# Patient Record
Sex: Female | Born: 1980 | Race: White | Hispanic: No | Marital: Married | Smoking: Never smoker
Health system: Southern US, Community
[De-identification: ages and names within clinical notes are randomized; demographics above are authoritative.]

## PROBLEM LIST (undated history)

## (undated) DIAGNOSIS — T7840XA Allergy, unspecified, initial encounter: Secondary | ICD-10-CM

## (undated) DIAGNOSIS — K602 Anal fissure, unspecified: Secondary | ICD-10-CM

## (undated) DIAGNOSIS — N200 Calculus of kidney: Secondary | ICD-10-CM

## (undated) HISTORY — PX: CHEST TUBE INSERTION: SHX231

## (undated) HISTORY — DX: Anal fissure, unspecified: K60.2

## (undated) HISTORY — DX: Allergy, unspecified, initial encounter: T78.40XA

## (undated) HISTORY — PX: WISDOM TOOTH EXTRACTION: SHX21

## (undated) HISTORY — PX: OTHER SURGICAL HISTORY: SHX169

## (undated) HISTORY — DX: Calculus of kidney: N20.0

---

## 2002-11-26 DIAGNOSIS — N200 Calculus of kidney: Secondary | ICD-10-CM

## 2002-11-26 HISTORY — DX: Calculus of kidney: N20.0

## 2005-07-31 ENCOUNTER — Encounter: Payer: Self-pay | Admitting: Emergency Medicine

## 2008-02-23 ENCOUNTER — Ambulatory Visit: Payer: Self-pay | Admitting: Gastroenterology

## 2008-03-16 ENCOUNTER — Emergency Department (HOSPITAL_COMMUNITY): Admission: EM | Admit: 2008-03-16 | Discharge: 2008-03-16 | Payer: Self-pay | Admitting: Emergency Medicine

## 2008-04-03 DIAGNOSIS — Z87442 Personal history of urinary calculi: Secondary | ICD-10-CM

## 2008-04-06 ENCOUNTER — Ambulatory Visit: Payer: Self-pay | Admitting: Gastroenterology

## 2008-04-06 DIAGNOSIS — K602 Anal fissure, unspecified: Secondary | ICD-10-CM | POA: Insufficient documentation

## 2009-04-26 ENCOUNTER — Inpatient Hospital Stay (HOSPITAL_COMMUNITY): Admission: AD | Admit: 2009-04-26 | Discharge: 2009-04-28 | Payer: Self-pay | Admitting: Obstetrics and Gynecology

## 2009-07-06 ENCOUNTER — Telehealth: Payer: Self-pay | Admitting: Gastroenterology

## 2009-12-29 ENCOUNTER — Encounter: Admission: RE | Admit: 2009-12-29 | Discharge: 2009-12-29 | Payer: Self-pay | Admitting: Family Medicine

## 2011-03-05 LAB — CBC
HCT: 41.6 % (ref 36.0–46.0)
Hemoglobin: 14.3 g/dL (ref 12.0–15.0)
MCHC: 34.4 g/dL (ref 30.0–36.0)
MCV: 89.8 fL (ref 78.0–100.0)
Platelets: 185 10*3/uL (ref 150–400)
Platelets: 218 10*3/uL (ref 150–400)
RBC: 3.7 MIL/uL — ABNORMAL LOW (ref 3.87–5.11)
RBC: 4.63 MIL/uL (ref 3.87–5.11)
RDW: 13.5 % (ref 11.5–15.5)
RDW: 13.7 % (ref 11.5–15.5)
WBC: 16.9 10*3/uL — ABNORMAL HIGH (ref 4.0–10.5)

## 2011-03-05 LAB — RH IMMUNE GLOB WKUP(>/=20WKS)(NOT WOMEN'S HOSP): Fetal Screen: NEGATIVE

## 2011-04-10 NOTE — Assessment & Plan Note (Signed)
[facility]                         GASTROENTEROLOGY OFFICE NOTE   NAME:Susan Key                       MRN:          [id number]  DATE:02/23/2008                            DOB:          03/30/1981    NEW GI CONSULTATION   She was self-referred.   CHIEF COMPLAINT:  Anal discomfort with BM.   HPI:  [name] is a very pleasant 30 year old woman who has had mild  intermittent rectal bleeding and anal discomfort on and off for at least  3-4 years.  She underwent a sigmoidoscopy at [facility] in 2006 and was told  that it was normal but that she probably had some intermittent anal  fissuring disease.  She moved from the [facility] area over the past  few months.  She has noticed that she is having a return of significant  anal discomfort but rare bleeding.  During January the pain was fairly  excruciating and she went to [facility] for evaluation.  They did an  examination and told her that she probably had an anal fissure.  She was  given nitroglycerin ointment and Xyralid cream, which she has been doing  fairly regularly.  They also put her on a Swiss Kriss laxative  formulation.  She has noticed that her anal discomfort has definitely  improved but she still does have some feeling of fullness or spasm-like  sensation with just about every bowel movement.  It was definitely  better than it was before.  She does not notice much bleeding at all  now.   REVIEW OF SYSTEMS:  Notable for no overt GI bleeding and also stable  weight and is otherwise essentially normal and is available on the  Nursing Intake Sheet.   PAST MEDICAL HISTORY:  1. History of kidney stones Fall of 2004.  2. Anal fissure, question as above.   CURRENT MEDICINES:  1. Birth control pills.  2. Xyralid cream.  3. Nitroglycerin 0.2% ointment.   ALLERGIES:  No known drug allergies.   SOCIAL HISTORY:  1. Married.  2. No children.  3. Lives with her husband.  4. Works as a  Lawyer.  5. Nonsmoker.  6. Rarely drinks alcohol.   FAMILY HISTORY:  1. Maternal grandmother with breast cancer.  2. No colon cancer or colon polyps in the family.   PHYSICAL EXAM:  Height 5 feet 4 inches, 124 pounds, blood pressure  98/48, pulse 68.  CONSTITUTIONAL:  Generally well appearing.  NEUROLOGIC:  Alert and oriented x3.  EYES:  Extraocular movements intact.  MOUTH:  Oropharynx moist, no lesions.  NECK:  Supple, no lymphadenopathy.  CARDIOVASCULAR:  Heart regular rate and rhythm.  LUNGS:  Clear to auscultation bilaterally.  ABDOMEN:  Soft, nontender, nondistended, normal bowel sounds.  EXTREMITIES:  No lower extremity edema.  SKIN:  No rashes or lesions on visible extremities.  ANORECTAL EXAMINATION:  With female CMA in room, revealed normal anus,  no fissuring disease.  On rectal palpation I felt a very small soft  nodularity in her posterior anal.  Stool was brown and not overtly  bloody.  No other masses in rectum.   ASSESSMENT AND PLAN:  A 30 year old woman with anal discomfort, likely  anal fissure.   She does not really have dramatic constipation but she says every once  in awhile she will really have to strain to move her bowels and I am  concerned that maybe at these times is when she may be creating these  anal fissures and then they just take quite awhile to heal up.  I am  getting her started on Citrucel fiber supplementation that she will  slowly increase up to a full heaping spoonful a day.  She will return to  see me in 8 weeks' time.  If her symptoms are not completely resolved, I  want to repeat a sigmoidoscopy since she does have a very slight but  soft nodule in her posterior anal canal that I did not get a very great  appreciation for in the examination room.     Rachael Fee, MD  Electronically Signed    DPJ/MedQ  DD: 02/23/2008  DT: 02/23/2008  Job #: [id number]

## 2011-05-21 ENCOUNTER — Encounter: Payer: Self-pay | Admitting: Gastroenterology

## 2011-05-21 ENCOUNTER — Telehealth: Payer: Self-pay | Admitting: Gastroenterology

## 2011-05-21 NOTE — Telephone Encounter (Signed)
Pt has had rectal bleeding, she was last seen in 2009.  Pt was scheduled to see Dr Christella Hartigan for evaluation.

## 2011-05-22 ENCOUNTER — Other Ambulatory Visit: Payer: Self-pay

## 2011-05-22 ENCOUNTER — Encounter: Payer: Self-pay | Admitting: Gastroenterology

## 2011-05-22 ENCOUNTER — Ambulatory Visit (INDEPENDENT_AMBULATORY_CARE_PROVIDER_SITE_OTHER): Payer: BC Managed Care – PPO | Admitting: Gastroenterology

## 2011-05-22 VITALS — BP 92/54 | HR 72 | Ht 65.0 in | Wt 124.6 lb

## 2011-05-22 DIAGNOSIS — K602 Anal fissure, unspecified: Secondary | ICD-10-CM

## 2011-05-22 MED ORDER — LIDOCAINE-HYDROCORTISONE ACE 3-0.5 % RE CREA: 1.0000 | TOPICAL_CREAM | RECTAL | Status: DC

## 2011-05-22 NOTE — Progress Notes (Signed)
Review of pertinent gastrointestinal problems: 1. 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 Flex sig at [facility] 2006 saw fissure, o/w was normal.  Flare of symptoms 2009, seen by Dr. Christella Hartigan, fiber helped.   HPI: This is a very pleasant 30 year old woman whom I last saw 3 years ago.   Has intermittent bright red bleeding (can last several days).  Has had mucous on stool.  She has pain with BM, pain with wiping during the bleeding episodes.  She has intermittent straining.    These are chronic problems that have been going on for 7-8 years. She has noticed really no significant changes in her symptoms  Currently [redacted] weeks pregnant.  Has been eating more fiber.  With 2010 pregnancy was very constipated, hemorrhoids.  After that childbirth she stopped fiber.  GM had colon cancer.      Review of systems: Pertinent positive and negative review of systems were noted in the above HPI section.  All other review of systems was otherwise negative.   Past Medical History  Diagnosis Date  . Anal fissure   . Kidney stones 2004    History reviewed. No pertinent past surgical history.   reports that she has never smoked. She has never used smokeless tobacco. She reports that she does not drink alcohol or use illicit drugs.  family history includes Breast cancer in her maternal grandmother and Colon polyps in her maternal grandmother.    Current Medications, Allergies were all reviewed with the patient via [facility] electronic medical record system.    Physical Exam: BP 92/54  Pulse 72  Ht 5\' 5"  (1.651 m)  Wt 124 lb 9.6 oz (56.518 kg)  BMI 20.73 kg/m2 Constitutional: generally well-appearing Psychiatric: alert and oriented x3 Eyes: extraocular movements intact Mouth: oral pharynx moist, no lesions Neck: supple no lymphadenopathy Cardiovascular: heart regular rate and rhythm Lungs: clear to auscultation bilaterally Abdomen: soft, nontender, nondistended, no obvious ascites, no  peritoneal signs, normal bowel sounds Extremities: no lower extremity edema bilaterally Skin: no lesions on visible extremities Rectal examination with female assistant in room: Small, nonthrombosed hemorrhoidal tissue externally. Shallow, 3 or 4 mm posterior anal fissure, midline, rectal examination found brown stool. 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 She will resume fiber supplements, sitz bath and topical ointments on a when necessary basis. She will return to me after her pregnancy and at that point I would likely proceed with repeat endoscopic evaluation with colonoscopy to make sure we are not missing anything else which I think is highly unlikely. If she is still bothered by chronic intermittent anal fissure symptoms then referred to a surgeon.

## 2011-05-22 NOTE — Patient Instructions (Addendum)
Please start taking citrucel (orange flavored) powder fiber supplement.  This may cause some bloating at first but that usually goes away. Begin with a small spoonful and work your way up to a large, heaping spoonful daily over a week. Sitz baths once daily Topical ointment called into your pharmacy apply when you are having anal discomfort once daily. A copy of this information will be made available to Dr. Manus Gunning. Return to see Dr. Christella Hartigan after you have delivered, Will readdress your fissure, probably proceed with colonoscopy and plan for surgical evaluation.

## 2011-06-22 ENCOUNTER — Other Ambulatory Visit: Payer: Self-pay

## 2011-06-29 ENCOUNTER — Ambulatory Visit: Payer: Self-pay | Admitting: Gastroenterology

## 2011-07-09 ENCOUNTER — Other Ambulatory Visit: Payer: Self-pay

## 2011-08-17 ENCOUNTER — Other Ambulatory Visit (HOSPITAL_COMMUNITY): Payer: Self-pay | Admitting: Obstetrics and Gynecology

## 2011-08-17 DIAGNOSIS — O358XX0 Maternal care for other (suspected) fetal abnormality and damage, not applicable or unspecified: Secondary | ICD-10-CM

## 2011-08-22 ENCOUNTER — Other Ambulatory Visit (HOSPITAL_COMMUNITY): Payer: Self-pay | Admitting: Obstetrics and Gynecology

## 2011-08-22 ENCOUNTER — Encounter (HOSPITAL_COMMUNITY): Payer: Self-pay

## 2011-08-22 ENCOUNTER — Ambulatory Visit (HOSPITAL_COMMUNITY)
Admission: RE | Admit: 2011-08-22 | Discharge: 2011-08-22 | Disposition: A | Discharge status: Home / Self Care | Payer: BC Managed Care – PPO | Source: Ambulatory Visit | Attending: Obstetrics and Gynecology | Admitting: Obstetrics and Gynecology

## 2011-08-22 ENCOUNTER — Other Ambulatory Visit: Payer: Self-pay | Admitting: Maternal and Fetal Medicine

## 2011-08-22 DIAGNOSIS — Z1389 Encounter for screening for other disorder: Secondary | ICD-10-CM | POA: Insufficient documentation

## 2011-08-22 DIAGNOSIS — O358XX0 Maternal care for other (suspected) fetal abnormality and damage, not applicable or unspecified: Secondary | ICD-10-CM

## 2011-08-22 DIAGNOSIS — Z363 Encounter for antenatal screening for malformations: Secondary | ICD-10-CM | POA: Insufficient documentation

## 2011-09-03 ENCOUNTER — Telehealth (HOSPITAL_COMMUNITY): Payer: Self-pay | Admitting: MS"

## 2011-09-03 NOTE — Telephone Encounter (Signed)
Discussed negative cystic fibrosis carrier screen with patient. Approximate 93% detection rate. Thus, her risk to be a carrier has been reduced to 1 in 343.

## 2011-11-27 NOTE — L&D Delivery Note (Signed)
Delivery Note At 10:35 PM a viable and healthy female was delivered via Vaginal, Spontaneous Delivery (Presentation: ROP ).  APGAR:9 9, ; weight 8 pounds and 10oz .   Placenta status:S I, . 3 vessel Cord:  with the following complications: none.  Cord pH: na  Anesthesia:  local Episiotomy: none Lacerations: second degree Suture Repair: 2.0 vicryl Est. Blood Loss (mL): 300  Mom to postpartum.  Baby to nursery-stable.  Marget Outten J 01/12/2012, 12:00 AM

## 2012-01-11 ENCOUNTER — Encounter (HOSPITAL_COMMUNITY): Payer: Self-pay | Admitting: Pediatric Intensive Care

## 2012-01-11 ENCOUNTER — Other Ambulatory Visit: Payer: Self-pay | Admitting: Obstetrics and Gynecology

## 2012-01-11 ENCOUNTER — Inpatient Hospital Stay (HOSPITAL_COMMUNITY)
Admission: AD | Admit: 2012-01-11 | Discharge: 2012-01-13 | DRG: 373 | Disposition: A | Discharge status: Home / Self Care | Payer: BC Managed Care – PPO | Source: Ambulatory Visit | Attending: Obstetrics and Gynecology | Admitting: Obstetrics and Gynecology

## 2012-01-11 LAB — CBC
HCT: 40.3 % (ref 36.0–46.0)
Hemoglobin: 13.4 g/dL (ref 12.0–15.0)
MCHC: 33.3 g/dL (ref 30.0–36.0)
RBC: 4.65 MIL/uL (ref 3.87–5.11)
WBC: 13.4 10*3/uL — ABNORMAL HIGH (ref 4.0–10.5)

## 2012-01-11 MED ORDER — LIDOCAINE HCL (PF) 1 % IJ SOLN
30.0000 mL | INTRAMUSCULAR | Status: DC | PRN
  Administered 2012-01-11: 30 mL via SUBCUTANEOUS
  Filled 2012-01-11: qty 30

## 2012-01-11 MED ORDER — ONDANSETRON HCL 4 MG/2ML IJ SOLN: 4.0000 mg | Freq: Four times a day (QID) | INTRAMUSCULAR | Status: DC | PRN

## 2012-01-11 MED ORDER — ACETAMINOPHEN 325 MG PO TABS: 650.0000 mg | ORAL_TABLET | ORAL | Status: DC | PRN

## 2012-01-11 MED ORDER — OXYTOCIN BOLUS FROM INFUSION
500.0000 mL | Freq: Once | INTRAVENOUS | Status: DC
  Filled 2012-01-11: qty 1000
  Filled 2012-01-11: qty 500

## 2012-01-11 MED ORDER — OXYTOCIN 20 UNITS IN LACTATED RINGERS INFUSION - SIMPLE
125.0000 mL/h | Freq: Once | INTRAVENOUS | Status: AC
  Administered 2012-01-11: 125 mL/h via INTRAVENOUS

## 2012-01-11 MED ORDER — LACTATED RINGERS IV SOLN: 500.0000 mL | INTRAVENOUS | Status: DC | PRN

## 2012-01-11 MED ORDER — IBUPROFEN 600 MG PO TABS
600.0000 mg | ORAL_TABLET | Freq: Four times a day (QID) | ORAL | Status: DC | PRN
  Administered 2012-01-12: 600 mg via ORAL
  Filled 2012-01-11: qty 1

## 2012-01-11 MED ORDER — LACTATED RINGERS IV SOLN
INTRAVENOUS | Status: DC
  Administered 2012-01-11: 23:00:00 via INTRAVENOUS

## 2012-01-11 MED ORDER — OXYCODONE-ACETAMINOPHEN 5-325 MG PO TABS
1.0000 | ORAL_TABLET | ORAL | Status: DC | PRN
Start: 2012-01-11 — End: 2012-01-12

## 2012-01-11 MED ORDER — FLEET ENEMA 7-19 GM/118ML RE ENEM: 1.0000 | ENEMA | RECTAL | Status: DC | PRN

## 2012-01-11 MED ORDER — CITRIC ACID-SODIUM CITRATE 334-500 MG/5ML PO SOLN: 30.0000 mL | ORAL | Status: DC | PRN

## 2012-01-11 NOTE — Progress Notes (Signed)
MD made aware of pt status  ?

## 2012-01-11 NOTE — Progress Notes (Signed)
Suni Jarnagin is a 31 y.o. G3P2011 at [redacted]w[redacted]d by LMP admitted for active labor  Subjective:   Objective: BP 104/42  Pulse 92  Temp(Src) 97.7 F (36.5 C) (Oral)  Resp 24  Ht 5\' 5"  (1.651 m)  Wt 67.586 kg (149 lb)  BMI 24.79 kg/m2  LMP 04/13/2011  Breastfeeding? Unknown      FHT:  FHR: 155 bpm, variability: moderate,  accelerations:  Present,  decelerations:  Absent and   UC:   regular, every 2 minutes SVE:   Dilation: Lip/rim Effacement (%): 100 Exam by:: Felipa Furnace RN  Labs: Lab Results  Component Value Date   WBC 13.4* 01/11/2012   HGB 13.4 01/11/2012   HCT 40.3 01/11/2012   MCV 86.7 01/11/2012   PLT 256 01/11/2012    Assessment / Plan: Spontaneous labor, progressing normally  Labor: Progressing normally Preeclampsia:  na Fetal Wellbeing:  Category I Pain Control:  Labor support without medications I/D:  n/a Anticipated MOD:  NSVD  Noely Kuhnle J 01/11/2012, 11:59 PM

## 2012-01-12 ENCOUNTER — Encounter (HOSPITAL_COMMUNITY): Payer: Self-pay | Admitting: Pediatric Intensive Care

## 2012-01-12 LAB — CBC
Hemoglobin: 11.7 g/dL — ABNORMAL LOW (ref 12.0–15.0)
RBC: 4.13 MIL/uL (ref 3.87–5.11)

## 2012-01-12 LAB — HIV ANTIBODY (ROUTINE TESTING W REFLEX): HIV: NONREACTIVE

## 2012-01-12 LAB — RPR
RPR Ser Ql: NONREACTIVE
RPR: NONREACTIVE

## 2012-01-12 LAB — HEPATITIS B SURFACE ANTIGEN: Hepatitis B Surface Ag: NEGATIVE

## 2012-01-12 LAB — ANTIBODY SCREEN: Antibody Screen: NEGATIVE

## 2012-01-12 MED ORDER — ONDANSETRON HCL 4 MG PO TABS: 4.0000 mg | ORAL_TABLET | ORAL | Status: DC | PRN

## 2012-01-12 MED ORDER — ZOLPIDEM TARTRATE 5 MG PO TABS: 5.0000 mg | ORAL_TABLET | Freq: Every evening | ORAL | Status: DC | PRN

## 2012-01-12 MED ORDER — SENNOSIDES-DOCUSATE SODIUM 8.6-50 MG PO TABS
2.0000 | ORAL_TABLET | Freq: Every day | ORAL | Status: DC
  Administered 2012-01-12: 2 via ORAL

## 2012-01-12 MED ORDER — OXYCODONE-ACETAMINOPHEN 5-325 MG PO TABS: 1.0000 | ORAL_TABLET | ORAL | Status: DC | PRN

## 2012-01-12 MED ORDER — BENZOCAINE-MENTHOL 20-0.5 % EX AERO
1.0000 "application " | INHALATION_SPRAY | CUTANEOUS | Status: DC | PRN
  Administered 2012-01-12: 1 via TOPICAL
  Filled 2012-01-12: qty 56

## 2012-01-12 MED ORDER — OXYTOCIN 20 UNITS IN LACTATED RINGERS INFUSION - SIMPLE: 125.0000 mL/h | INTRAVENOUS | Status: DC

## 2012-01-12 MED ORDER — IBUPROFEN 600 MG PO TABS
600.0000 mg | ORAL_TABLET | Freq: Four times a day (QID) | ORAL | Status: DC
  Administered 2012-01-12 – 2012-01-13 (×6): 600 mg via ORAL
  Filled 2012-01-12 (×6): qty 1

## 2012-01-12 MED ORDER — TETANUS-DIPHTH-ACELL PERTUSSIS 5-2.5-18.5 LF-MCG/0.5 IM SUSP: 0.5000 mL | Freq: Once | INTRAMUSCULAR | Status: DC

## 2012-01-12 MED ORDER — LANOLIN HYDROUS EX OINT: TOPICAL_OINTMENT | CUTANEOUS | Status: DC | PRN

## 2012-01-12 MED ORDER — METHYLERGONOVINE MALEATE 0.2 MG PO TABS: 0.2000 mg | ORAL_TABLET | ORAL | Status: DC | PRN

## 2012-01-12 MED ORDER — DIPHENHYDRAMINE HCL 25 MG PO CAPS: 25.0000 mg | ORAL_CAPSULE | Freq: Four times a day (QID) | ORAL | Status: DC | PRN

## 2012-01-12 MED ORDER — SIMETHICONE 80 MG PO CHEW: 80.0000 mg | CHEWABLE_TABLET | ORAL | Status: DC | PRN

## 2012-01-12 MED ORDER — METHYLERGONOVINE MALEATE 0.2 MG/ML IJ SOLN: 0.2000 mg | INTRAMUSCULAR | Status: DC | PRN

## 2012-01-12 MED ORDER — WITCH HAZEL-GLYCERIN EX PADS
1.0000 "application " | MEDICATED_PAD | CUTANEOUS | Status: DC | PRN
  Administered 2012-01-12: 1 via TOPICAL

## 2012-01-12 MED ORDER — BENZOCAINE-MENTHOL 20-0.5 % EX AERO
INHALATION_SPRAY | CUTANEOUS | Status: AC
  Filled 2012-01-12: qty 56

## 2012-01-12 MED ORDER — PRENATAL MULTIVITAMIN CH
1.0000 | ORAL_TABLET | Freq: Every day | ORAL | Status: DC
  Administered 2012-01-12 – 2012-01-13 (×2): 1 via ORAL
  Filled 2012-01-12 (×2): qty 1

## 2012-01-12 MED ORDER — ONDANSETRON HCL 4 MG/2ML IJ SOLN: 4.0000 mg | INTRAMUSCULAR | Status: DC | PRN

## 2012-01-12 MED ORDER — DIBUCAINE 1 % RE OINT
1.0000 "application " | TOPICAL_OINTMENT | RECTAL | Status: DC | PRN
  Administered 2012-01-12: 1 via RECTAL
  Filled 2012-01-12 (×2): qty 28

## 2012-01-12 NOTE — Progress Notes (Signed)
PPD 1 SVD  S:  Reports feeling well, happy w/ birth experience.             Tolerating po/ No nausea or vomiting             Bleeding is moderate             Pain controlled with motrin             Up ad lib / ambulatory  Newborn  Information for the patient's newborn:  Juline, Sanderford [[id number]]  female  breast feeding  / Circumcision planned later today   O:  A & O x 3 NAD             VS: Blood pressure 110/73, pulse 58, temperature 97.8 F (36.6 C), temperature source Oral, resp. rate 18, height 5\' 5"  (1.651 m), weight 149 lb (67.586 kg), last menstrual period 04/13/2011, SpO2 99.00%, unknown if currently breastfeeding.  LABS:  Basename 01/12/12 0539 01/11/12 2255  HGB 11.7* 13.4  HCT 35.6* 40.3    I&O:        Lungs: Clear and unlabored  Heart: regular rate and rhythm / no mumurs  Abdomen: soft, non-tender, non-distended              Fundus: firm, non-tender, @U   Perineum: repair intact, mild edema  Lochia: small  Extremities: no edema, no calf pain or tenderness, neg Homans    A/P: PPD # 1 31 y.o., [id number] S/P:spontaneous vaginal   Principal Problem:  *PP care - s/p SVD 2/15  RH neg, Rhogam w/u complete, will administer prophylactically     Doing well - stable status  Routine post partum orders  Anticipate discharge home in AM.   Journe Hallmark, CNM, MSN 01/12/2012, 11:42 AM

## 2012-01-12 NOTE — H&P (Signed)
NAMESIMMONE, CAPE NO.:  [id number]  MEDICAL RECORD NO.:  [id number]  LOCATION:  9142                          FACILITY:  [facility]  PHYSICIAN:  Lenoard Aden, M.D.DATE OF BIRTH:  02-24-1981  DATE OF ADMISSION:  01/11/2012 DATE OF DISCHARGE:                             HISTORY & PHYSICAL   CHIEF COMPLAINT:  Labor.  HISTORY OF PRESENT ILLNESS:  A 31 year old white female, G3, P1-0-1-1 at 85 weeks' gestation who presents in active labor.  She has no known drug allergies.  Medications are prenatal vitamins.  She is a nonsmoker, nondrinker.  She denies domestic or physical violence.  She has a personal history of kidney stones and hemorrhoids.  Family history of COPD, heart disease, and nicotine dependence.  She has a previous SAB at 4 weeks, and an uncomplicated vaginal delivery in 2010, at 39 weeks.  Prenatal course complicated by an abnormal ultrasound with an echogenic focus, which was normal on followup ultrasound, otherwise prenatal course uncomplicated.  PHYSICAL EXAMINATION:  GENERAL:  A well-developed, well-nourished white female, in moderate amount of distress. HEENT:  Normal. NECK:  Supple.  Full range of motion. LUNGS:  Clear. HEART:  Regular rhythm. ABDOMEN:  Soft, gravid, nontender.  Estimated fetal weight 8.5 to 9 pounds.  Cervix is 10 cm, 100%, vertex, +1. EXTREMITIES:  There are no cords. NEUROLOGIC:  Nonfocal. SKIN:  Intact.  IMPRESSION:  Term intrauterine pregnancy in active labor.  PLAN:  Anticipate vaginal delivery.     Lenoard Aden, M.D.     RJT/MEDQ  D:  01/11/2012  T:  01/12/2012  Job:  [id number]

## 2012-01-12 NOTE — Progress Notes (Signed)
Pt delivered viable female with APGARS 9, 9 SVD by Dr Billy Coast.

## 2012-01-13 MED ORDER — IBUPROFEN 600 MG PO TABS: 600.0000 mg | ORAL_TABLET | Freq: Four times a day (QID) | ORAL | Status: AC

## 2012-01-13 MED ORDER — RHO D IMMUNE GLOBULIN 1500 UNIT/2ML IJ SOLN
300.0000 ug | Freq: Once | INTRAMUSCULAR | Status: AC
  Administered 2012-01-13: 300 ug via INTRAMUSCULAR
  Filled 2012-01-13: qty 2

## 2012-01-13 NOTE — Discharge Summary (Signed)
Obstetric Discharge Summary Reason for Admission: onset of labor Prenatal Procedures: ultrasound Intrapartum Procedures: spontaneous vaginal delivery Postpartum Procedures: Rho(D) Ig Complications-Operative and Postpartum: 2nd  degree perineal laceration Hemoglobin  Date Value Range Status  01/12/2012 11.7* 12.0-15.0 (g/dL) Final     HCT  Date Value Range Status  01/12/2012 35.6* 36.0-46.0 (%) Final    Discharge Diagnoses: Term Pregnancy-delivered  Discharge Information: Date: 01/13/2012 Activity: pelvic rest Diet: routine Medications: PNV and Ibuprofen Condition: stable Instructions: refer to practice specific booklet Discharge to: home Follow-up Information    Follow up with Lenoard Aden, MD. Schedule an appointment as soon as possible for a visit in 6 weeks.   Contact information:   [address] [place] [address] [phone]          Newborn Data: Live born female  Birth Weight: 8 lb 10.1 oz (3915 g) APGAR: 9, 9  Home with mother.  Susan Key 01/13/2012, 11:51 AM

## 2012-01-13 NOTE — Progress Notes (Signed)
Post Partum Day #2            Information for the patient's newborn:  Corisa, Montini [[id number]]  female   / circumcision planned before discharge today Feeding: breast  Subjective: No HA, SOB, CP, F/C, breast symptoms. Pain minimal. Normal vaginal bleeding, no clots.      Objective:  Temp:  [98.2 F (36.8 C)-98.6 F (37 C)] 98.6 F (37 C) (02/17 0600) Pulse Rate:  [60-69] 60  (02/17 0600) Resp:  [18-20] 20  (02/17 0600) BP: (106-118)/(66-79) 118/79 mmHg (02/17 0600)  No intake or output data in the 24 hours ending 01/13/12 1145     Basename 01/12/12 0539 01/11/12 2255  WBC 16.4* 13.4*  HGB 11.7* 13.4  HCT 35.6* 40.3  PLT 213 256    Blood type: --/--/A NEG (02/16 0539) / Rhogam given Rubella: Immune (02/16 0004)    Physical Exam:  General: alert, cooperative and no distress Uterine Fundus: firm Lochia: appropriate Perineum: repair intact, edema none DVT Evaluation: Negative Homan's sign. No significant calf/ankle edema.    Assessment/Plan: PPD # 2 / 31 y.o., [id number] S/P:spontaneous vaginal   Principal Problem:  *PP care - s/p SVD 2/15    normal postpartum exam  Continue current postpartum care  D/C home   LOS: 2 days   Kimra Kantor, CNM, MSN 01/13/2012, 11:45 AM

## 2012-01-14 LAB — RH IG WORKUP (INCLUDES ABO/RH)
ABO/RH(D): A NEG
Antibody Screen: POSITIVE
Fetal Screen: NEGATIVE
Gestational Age(Wks): 39

## 2012-01-18 ENCOUNTER — Inpatient Hospital Stay (HOSPITAL_COMMUNITY): Admission: RE | Admit: 2012-01-18 | Payer: Self-pay | Source: Ambulatory Visit | Admitting: Obstetrics and Gynecology

## 2012-03-03 ENCOUNTER — Encounter: Payer: Self-pay | Admitting: Gastroenterology

## 2012-03-03 ENCOUNTER — Ambulatory Visit (INDEPENDENT_AMBULATORY_CARE_PROVIDER_SITE_OTHER): Payer: BC Managed Care – PPO | Admitting: Gastroenterology

## 2012-03-03 VITALS — BP 90/50 | HR 78 | Ht 65.0 in | Wt 134.2 lb

## 2012-03-03 DIAGNOSIS — K625 Hemorrhage of anus and rectum: Secondary | ICD-10-CM

## 2012-03-03 MED ORDER — MOVIPREP 100 G PO SOLR: 1.0000 | ORAL | Status: DC

## 2012-03-03 NOTE — Patient Instructions (Signed)
Retry fiber supplement with daily citrucel. You will be set up for a colonoscopy at [facility] for rectal bleeding.

## 2012-03-03 NOTE — Progress Notes (Signed)
HPI: This is a  very pleasant 31 year old woman who is here with her 62-week-old son and HER-3-year-old daughter. I last saw her June 2012. She is still bothered by intermittent rectal bleeding, anal fissure discomfort. We had planned for her to return to see me after her delivery to revisit on her symptoms and consider colonoscopy.   She is still bothered by anal fissure.  Better now that she is post pardum.  When pregnant very bothered with constipation.    Still sees blood on TP, can drip into toilette.  Has pain with BM.  Improving lately.  Her bowels lately are more scibbolous.  Tried fiber but not for very long, she was nervous about ingesting any unneeded medicines during her pregnancy.   Past Medical History  Diagnosis Date  . Anal fissure   . Kidney stones 2004  . PP care - s/p SVD 2/15 01/11/2012    Past Surgical History  Procedure Date  . Chest tube insertion     Current Outpatient Prescriptions  Medication Sig Dispense Refill  . Prenatal Vit-Fe Psac Cmplx-FA (PRENATAL MULTIVITAMIN) 60-1 MG tablet Take 1 tablet by mouth daily with breakfast.          Allergies as of 03/03/2012  . (No Known Allergies)    Family History  Problem Relation Age of Onset  . Breast cancer Maternal Grandmother   . Colon polyps Maternal Grandmother     Cancerous    History   Social History  . Marital Status: Married    Spouse Name: N/A    Number of Children: 2  . Years of Education: N/A   Occupational History  . Stay at home Mom    Social History Main Topics  . Smoking status: Never Smoker   . Smokeless tobacco: Never Used  . Alcohol Use: No  . Drug Use: No  . Sexually Active: Yes   Other Topics Concern  . Not on file   Social History Narrative  . No narrative on file      Physical Exam: BP 90/50  Pulse 78  Ht 5\' 5"  (1.651 m)  Wt 134 lb 3.2 oz (60.873 kg)  BMI 22.33 kg/m2  Breastfeeding? Yes Constitutional: generally well-appearing Psychiatric: alert and  oriented x3 Abdomen: soft, nontender, nondistended, no obvious ascites, no peritoneal signs, normal bowel sounds     Assessment and plan: 31 y.o. female with intermittent rectal bleeding, history of anal fissure  We will proceed with colonoscopy at her soonest convenience. She is breast-feeding and we discussed her using the pump and dump method.  I suggested she retry fiber supplements since she has small, pebble-like stools with some straining. She will give it another try.

## 2012-03-04 ENCOUNTER — Encounter: Payer: Self-pay | Admitting: Gastroenterology

## 2012-03-24 ENCOUNTER — Encounter (HOSPITAL_COMMUNITY): Payer: BC Managed Care – PPO

## 2012-04-03 ENCOUNTER — Telehealth: Payer: Self-pay | Admitting: Gastroenterology

## 2012-04-03 NOTE — Telephone Encounter (Signed)
Pt was advised that it was ok to have her procedure as planned.

## 2012-04-04 ENCOUNTER — Ambulatory Visit (AMBULATORY_SURGERY_CENTER): Payer: BC Managed Care – PPO | Admitting: Gastroenterology

## 2012-04-04 ENCOUNTER — Encounter: Payer: Self-pay | Admitting: Gastroenterology

## 2012-04-04 VITALS — BP 112/74 | HR 98 | Temp 97.6°F | Resp 20 | Ht 65.0 in | Wt 134.0 lb

## 2012-04-04 DIAGNOSIS — K625 Hemorrhage of anus and rectum: Secondary | ICD-10-CM

## 2012-04-04 DIAGNOSIS — K648 Other hemorrhoids: Secondary | ICD-10-CM

## 2012-04-04 DIAGNOSIS — K644 Residual hemorrhoidal skin tags: Secondary | ICD-10-CM

## 2012-04-04 MED ORDER — SODIUM CHLORIDE 0.9 % IV SOLN: 500.0000 mL | INTRAVENOUS | Status: DC

## 2012-04-04 NOTE — Patient Instructions (Signed)
Discharge instructions given with verbal understanding. Handouts on hemorrhoids and a high fiber diet given. Resume previous medications. YOU HAD AN ENDOSCOPIC PROCEDURE TODAY AT THE [facility]: Refer to the procedure report that was given to you for any specific questions about what was found during the examination.  If the procedure report does not answer your questions, please call your gastroenterologist to clarify.  If you requested that your care partner not be given the details of your procedure findings, then the procedure report has been included in a sealed envelope for you to review at your convenience later.  YOU SHOULD EXPECT: Some feelings of bloating in the abdomen. Passage of more gas than usual.  Walking can help get rid of the air that was put into your GI tract during the procedure and reduce the bloating. If you had a lower endoscopy (such as a colonoscopy or flexible sigmoidoscopy) you may notice spotting of blood in your stool or on the toilet paper. If you underwent a bowel prep for your procedure, then you may not have a normal bowel movement for a few days.  DIET: Your first meal following the procedure should be a light meal and then it is ok to progress to your normal diet.  A half-sandwich or bowl of soup is an example of a good first meal.  Heavy or fried foods are harder to digest and may make you feel nauseous or bloated.  Likewise meals heavy in dairy and vegetables can cause extra gas to form and this can also increase the bloating.  Drink plenty of fluids but you should avoid alcoholic beverages for 24 hours.  ACTIVITY: Your care partner should take you home directly after the procedure.  You should plan to take it easy, moving slowly for the rest of the day.  You can resume normal activity the day after the procedure however you should NOT DRIVE or use heavy machinery for 24 hours (because of the sedation medicines used during the test).    SYMPTOMS TO  REPORT IMMEDIATELY: A gastroenterologist can be reached at any hour.  During normal business hours, 8:30 AM to 5:00 PM Monday through Friday, call [phone].  After hours and on weekends, please call the GI answering service at [phone] who will take a message and have the physician on call contact you.   Following lower endoscopy (colonoscopy or flexible sigmoidoscopy):  Excessive amounts of blood in the stool  Significant tenderness or worsening of abdominal pains  Swelling of the abdomen that is new, acute  Fever of 100F or higher FOLLOW UP: If any biopsies were taken you will be contacted by phone or by letter within the next 1-3 weeks.  Call your gastroenterologist if you have not heard about the biopsies in 3 weeks.  Our staff will call the home number listed on your records the next business day following your procedure to check on you and address any questions or concerns that you may have at that time regarding the information given to you following your procedure. This is a courtesy call and so if there is no answer at the home number and we have not heard from you through the emergency physician on call, we will assume that you have returned to your regular daily activities without incident.  SIGNATURES/CONFIDENTIALITY: You and/or your care partner have signed paperwork which will be entered into your electronic medical record.  These signatures attest to the fact that that the information above on your   After Visit Summary has been reviewed and is understood.  Full responsibility of the confidentiality of this discharge information lies with you and/or your care-partner.  

## 2012-04-04 NOTE — Op Note (Signed)
[facility] [address] [place], [place]  [address]  COLONOSCOPY PROCEDURE REPORT  PATIENT:  Susan Key, Susan Key  MR#:  [id number] BIRTHDATE:  1981-11-08, 30 yrs. old  GENDER:  female ENDOSCOPIST:  Rachael Fee, MD PROCEDURE DATE:  04/04/2012 PROCEDURE:  Colonoscopy 44010 ASA CLASS:  Class I INDICATIONS:  minor rectal bleeding MEDICATIONS:   Fentanyl 75 mcg IV, These medications were titrated to patient response per physician's verbal order, Versed 8 mg IV  DESCRIPTION OF PROCEDURE:   After the risks benefits and alternatives of the procedure were thoroughly explained, informed consent was obtained.  Digital rectal exam was performed and revealed no rectal masses.   The LB [id number] endoscope was introduced through the anus and advanced to the cecum, which was identified by both the appendix and ileocecal valve, without limitations.  The quality of the prep was good..  The instrument was then slowly withdrawn as the colon was fully examined. <<PROCEDUREIMAGES>> FINDINGS:  Internal and external Hemorrhoids were found.  This was otherwise a normal examination of the colon (see image2 and image3).   Retroflexed views in the rectum revealed no abnormalities. COMPLICATIONS:  None  ENDOSCOPIC IMPRESSION: 1) Internal and external hemorrhoids 2) Otherwise normal examination; no polyps or cancers  RECOMMENDATIONS: Try once daily fiber supplements to help regulate your bowels, this in turn can help hemorrhoid/fissures.  ______________________________ Rachael Fee, MD  n. eSIGNED:   Rachael Fee at 04/04/2012 02:21 PM  Valinda Party, [phone]

## 2012-04-04 NOTE — Progress Notes (Signed)
Patient did not experience any of the following events: a burn prior to discharge; a fall within the facility; wrong site/side/patient/procedure/implant event; or a hospital transfer or hospital admission upon discharge from the facility. (G8907) Patient did not have preoperative order for IV antibiotic SSI prophylaxis. (G8918)  

## 2012-04-04 NOTE — Progress Notes (Signed)
BP 84/54, Dr. Christella Hartigan aware.  Her blood pressure prior to procedure was 112/74.

## 2012-04-07 ENCOUNTER — Telehealth: Payer: Self-pay

## 2012-04-07 NOTE — Telephone Encounter (Signed)
  Follow up Call-  Call back number 04/04/2012  Post procedure Call Back phone  # [phone] cell  Permission to leave phone message Yes     Patient questions:  Do you have a fever, pain , or abdominal swelling? no Pain Score  0 *  Have you tolerated food without any problems? yes  Have you been able to return to your normal activities? yes  Do you have any questions about your discharge instructions: Diet   no Medications  no Follow up visit  no  Do you have questions or concerns about your Care? no  Actions: * If pain score is 4 or above: No action needed, pain <4.

## 2012-10-10 LAB — OB RESULTS CONSOLE HGB/HCT, BLOOD: Hemoglobin: 13.1 g/dL

## 2012-11-06 LAB — OB RESULTS CONSOLE HIV ANTIBODY (ROUTINE TESTING): HIV: NONREACTIVE

## 2012-11-06 LAB — OB RESULTS CONSOLE RPR: RPR: NONREACTIVE

## 2012-11-26 NOTE — L&D Delivery Note (Signed)
Delivery Note At 3:59 PM a viable and healthy female was delivered via Vaginal, Spontaneous Delivery (Presentation:ROA ).  APGAR: 9, 9; weight pending.   Placenta status: intact, Spontaneous.  Cord: 3 vessels with the following complications: None.  Cord pH: na  Anesthesia: Epidural  Episiotomy: none Lacerations: second Suture Repair: 2.0 vicryl rapide Est. Blood Loss (mL): 200  Mom to postpartum.  Baby to nursery-stable.  Jaymian Bogart J 06/07/2013, 4:15 PM

## 2012-11-27 LAB — OB RESULTS CONSOLE GC/CHLAMYDIA
Chlamydia: NEGATIVE
Gonorrhea: NEGATIVE

## 2013-03-25 LAB — OB RESULTS CONSOLE RPR: RPR: NONREACTIVE

## 2013-06-04 ENCOUNTER — Other Ambulatory Visit: Payer: Self-pay | Admitting: Obstetrics and Gynecology

## 2013-06-07 ENCOUNTER — Inpatient Hospital Stay (HOSPITAL_COMMUNITY)
Admission: AD | Admit: 2013-06-07 | Discharge: 2013-06-09 | DRG: 373 | Disposition: A | Discharge status: Home / Self Care | Payer: BC Managed Care – PPO | Source: Ambulatory Visit | Attending: Obstetrics and Gynecology | Admitting: Obstetrics and Gynecology

## 2013-06-07 ENCOUNTER — Encounter (HOSPITAL_COMMUNITY): Payer: Self-pay | Admitting: *Deleted

## 2013-06-07 ENCOUNTER — Inpatient Hospital Stay (HOSPITAL_COMMUNITY): Payer: BC Managed Care – PPO | Admitting: Anesthesiology

## 2013-06-07 ENCOUNTER — Encounter (HOSPITAL_COMMUNITY): Payer: Self-pay | Admitting: Anesthesiology

## 2013-06-07 DIAGNOSIS — K602 Anal fissure, unspecified: Secondary | ICD-10-CM

## 2013-06-07 DIAGNOSIS — Z87442 Personal history of urinary calculi: Secondary | ICD-10-CM

## 2013-06-07 LAB — CBC
HCT: 39.2 % (ref 36.0–46.0)
Hemoglobin: 13.3 g/dL (ref 12.0–15.0)
MCH: 28.5 pg (ref 26.0–34.0)
MCV: 84.1 fL (ref 78.0–100.0)
RBC: 4.66 MIL/uL (ref 3.87–5.11)
WBC: 12.1 10*3/uL — ABNORMAL HIGH (ref 4.0–10.5)

## 2013-06-07 MED ORDER — PHENYLEPHRINE 40 MCG/ML (10ML) SYRINGE FOR IV PUSH (FOR BLOOD PRESSURE SUPPORT)
80.0000 ug | PREFILLED_SYRINGE | INTRAVENOUS | Status: DC | PRN
  Filled 2013-06-07: qty 2
  Filled 2013-06-07: qty 5

## 2013-06-07 MED ORDER — LANOLIN HYDROUS EX OINT: TOPICAL_OINTMENT | CUTANEOUS | Status: DC | PRN

## 2013-06-07 MED ORDER — DIPHENHYDRAMINE HCL 50 MG/ML IJ SOLN: 12.5000 mg | INTRAMUSCULAR | Status: DC | PRN

## 2013-06-07 MED ORDER — OXYTOCIN 40 UNITS IN LACTATED RINGERS INFUSION - SIMPLE MED
62.5000 mL/h | INTRAVENOUS | Status: DC
  Administered 2013-06-07: 62.5 mL/h via INTRAVENOUS
  Filled 2013-06-07: qty 1000

## 2013-06-07 MED ORDER — TETANUS-DIPHTH-ACELL PERTUSSIS 5-2.5-18.5 LF-MCG/0.5 IM SUSP: 0.5000 mL | Freq: Once | INTRAMUSCULAR | Status: DC

## 2013-06-07 MED ORDER — FLEET ENEMA 7-19 GM/118ML RE ENEM: 1.0000 | ENEMA | RECTAL | Status: DC | PRN

## 2013-06-07 MED ORDER — FENTANYL 2.5 MCG/ML BUPIVACAINE 1/10 % EPIDURAL INFUSION (WH - ANES)
14.0000 mL/h | INTRAMUSCULAR | Status: DC | PRN
  Filled 2013-06-07: qty 125

## 2013-06-07 MED ORDER — LIDOCAINE HCL (PF) 1 % IJ SOLN
INTRAMUSCULAR | Status: DC | PRN
  Administered 2013-06-07: 5 mL
  Administered 2013-06-07: 3 mL
  Administered 2013-06-07: 5 mL

## 2013-06-07 MED ORDER — WITCH HAZEL-GLYCERIN EX PADS: 1.0000 "application " | MEDICATED_PAD | CUTANEOUS | Status: DC | PRN

## 2013-06-07 MED ORDER — LACTATED RINGERS IV SOLN
500.0000 mL | Freq: Once | INTRAVENOUS | Status: AC
  Administered 2013-06-07: 500 mL via INTRAVENOUS

## 2013-06-07 MED ORDER — METHYLERGONOVINE MALEATE 0.2 MG/ML IJ SOLN: 0.2000 mg | INTRAMUSCULAR | Status: DC | PRN

## 2013-06-07 MED ORDER — CITRIC ACID-SODIUM CITRATE 334-500 MG/5ML PO SOLN: 30.0000 mL | ORAL | Status: DC | PRN

## 2013-06-07 MED ORDER — ONDANSETRON HCL 4 MG/2ML IJ SOLN: 4.0000 mg | INTRAMUSCULAR | Status: DC | PRN

## 2013-06-07 MED ORDER — SENNOSIDES-DOCUSATE SODIUM 8.6-50 MG PO TABS
2.0000 | ORAL_TABLET | Freq: Every day | ORAL | Status: DC
  Administered 2013-06-07: 2 via ORAL

## 2013-06-07 MED ORDER — DIBUCAINE 1 % RE OINT: 1.0000 "application " | TOPICAL_OINTMENT | RECTAL | Status: DC | PRN

## 2013-06-07 MED ORDER — BUTORPHANOL TARTRATE 1 MG/ML IJ SOLN: 1.0000 mg | INTRAMUSCULAR | Status: DC | PRN

## 2013-06-07 MED ORDER — BENZOCAINE-MENTHOL 20-0.5 % EX AERO
1.0000 "application " | INHALATION_SPRAY | CUTANEOUS | Status: DC | PRN
  Administered 2013-06-07: 1 via TOPICAL
  Filled 2013-06-07: qty 56

## 2013-06-07 MED ORDER — SIMETHICONE 80 MG PO CHEW: 80.0000 mg | CHEWABLE_TABLET | ORAL | Status: DC | PRN

## 2013-06-07 MED ORDER — PHENYLEPHRINE 40 MCG/ML (10ML) SYRINGE FOR IV PUSH (FOR BLOOD PRESSURE SUPPORT)
80.0000 ug | PREFILLED_SYRINGE | INTRAVENOUS | Status: DC | PRN
  Filled 2013-06-07: qty 2

## 2013-06-07 MED ORDER — ONDANSETRON HCL 4 MG PO TABS: 4.0000 mg | ORAL_TABLET | ORAL | Status: DC | PRN

## 2013-06-07 MED ORDER — ZOLPIDEM TARTRATE 5 MG PO TABS: 5.0000 mg | ORAL_TABLET | Freq: Every evening | ORAL | Status: DC | PRN

## 2013-06-07 MED ORDER — ONDANSETRON HCL 4 MG/2ML IJ SOLN: 4.0000 mg | Freq: Four times a day (QID) | INTRAMUSCULAR | Status: DC | PRN

## 2013-06-07 MED ORDER — OXYCODONE-ACETAMINOPHEN 5-325 MG PO TABS: 1.0000 | ORAL_TABLET | ORAL | Status: DC | PRN

## 2013-06-07 MED ORDER — IBUPROFEN 600 MG PO TABS: 600.0000 mg | ORAL_TABLET | Freq: Four times a day (QID) | ORAL | Status: DC | PRN

## 2013-06-07 MED ORDER — DIPHENHYDRAMINE HCL 25 MG PO CAPS: 25.0000 mg | ORAL_CAPSULE | Freq: Four times a day (QID) | ORAL | Status: DC | PRN

## 2013-06-07 MED ORDER — FENTANYL 2.5 MCG/ML BUPIVACAINE 1/10 % EPIDURAL INFUSION (WH - ANES)
INTRAMUSCULAR | Status: DC | PRN
  Administered 2013-06-07: 14 mL/h via EPIDURAL

## 2013-06-07 MED ORDER — OXYTOCIN BOLUS FROM INFUSION: 500.0000 mL | INTRAVENOUS | Status: DC

## 2013-06-07 MED ORDER — EPHEDRINE 5 MG/ML INJ
10.0000 mg | INTRAVENOUS | Status: DC | PRN
  Filled 2013-06-07: qty 2

## 2013-06-07 MED ORDER — METHYLERGONOVINE MALEATE 0.2 MG PO TABS: 0.2000 mg | ORAL_TABLET | ORAL | Status: DC | PRN

## 2013-06-07 MED ORDER — ACETAMINOPHEN 325 MG PO TABS: 650.0000 mg | ORAL_TABLET | ORAL | Status: DC | PRN

## 2013-06-07 MED ORDER — LACTATED RINGERS IV SOLN: 500.0000 mL | INTRAVENOUS | Status: DC | PRN

## 2013-06-07 MED ORDER — PRENATAL MULTIVITAMIN CH
1.0000 | ORAL_TABLET | Freq: Every day | ORAL | Status: DC
  Administered 2013-06-08: 1 via ORAL
  Filled 2013-06-07: qty 1

## 2013-06-07 MED ORDER — LACTATED RINGERS IV SOLN
INTRAVENOUS | Status: DC
  Administered 2013-06-07: 14:00:00 via INTRAVENOUS

## 2013-06-07 MED ORDER — IBUPROFEN 600 MG PO TABS
600.0000 mg | ORAL_TABLET | Freq: Four times a day (QID) | ORAL | Status: DC
  Administered 2013-06-07 – 2013-06-09 (×6): 600 mg via ORAL
  Filled 2013-06-07 (×6): qty 1

## 2013-06-07 MED ORDER — EPHEDRINE 5 MG/ML INJ
10.0000 mg | INTRAVENOUS | Status: DC | PRN
  Filled 2013-06-07: qty 4
  Filled 2013-06-07: qty 2

## 2013-06-07 MED ORDER — LIDOCAINE HCL (PF) 1 % IJ SOLN
30.0000 mL | INTRAMUSCULAR | Status: DC | PRN
  Filled 2013-06-07 (×2): qty 30

## 2013-06-07 NOTE — Anesthesia Procedure Notes (Signed)
Epidural Patient location during procedure: OB  Staffing Anesthesiologist: Phillips Grout Performed by: anesthesiologist   Preanesthetic Checklist Completed: patient identified, site marked, surgical consent, pre-op evaluation, timeout performed, IV checked, risks and benefits discussed and monitors and equipment checked  Epidural Patient position: sitting Prep: ChloraPrep Patient monitoring: heart rate, continuous pulse ox and blood pressure Approach: midline Injection technique: LOR saline  Needle:  Needle type: Tuohy  Needle gauge: 17 G Needle length: 9 cm and 9 Needle insertion depth: 4 cm Catheter type: closed end flexible Catheter size: 20 Guage Catheter at skin depth: 8 cm Test dose: negative  Assessment Events: blood not aspirated, injection not painful, no injection resistance, negative IV test and no paresthesia  Additional Notes   Patient tolerated the insertion well without complications.

## 2013-06-07 NOTE — Progress Notes (Signed)
Susan Key is a 32 y.o. [id number] at Unknown by LMP admitted for active labor, rupture of membranes  Subjective: Leaking fluid  Objective: VS pending      FHT:  FHR: 155 bpm, variability: moderate,  accelerations:  Present,  decelerations:  Absent UC:   irregular, every 10 minutes SVE:    5-6/90/0 AROM - forebag-clear  Labs: Cbc pending  Assessment / Plan: Spontaneous labor, progressing normally  Labor: Progressing normally Preeclampsia:  labs stable Fetal Wellbeing:  Category I Pain Control:  Labor support without medications I/D:  n/a Anticipated MOD:  NSVD  Susan Key J 06/07/2013, 1:21 PM

## 2013-06-07 NOTE — Anesthesia Preprocedure Evaluation (Signed)

## 2013-06-07 NOTE — Progress Notes (Signed)
Pt ambulating on L&D unit  with telemetry

## 2013-06-08 LAB — CBC
HCT: 36.9 % (ref 36.0–46.0)
MCHC: 33.6 g/dL (ref 30.0–36.0)
MCV: 84.6 fL (ref 78.0–100.0)
Platelets: 228 10*3/uL (ref 150–400)
RDW: 13.9 % (ref 11.5–15.5)
WBC: 16.3 10*3/uL — ABNORMAL HIGH (ref 4.0–10.5)

## 2013-06-08 MED ORDER — RHO D IMMUNE GLOBULIN 1500 UNIT/2ML IJ SOLN
300.0000 ug | Freq: Once | INTRAMUSCULAR | Status: AC
  Administered 2013-06-08: 300 ug via INTRAMUSCULAR
  Filled 2013-06-08: qty 2

## 2013-06-08 MED ORDER — MEASLES, MUMPS & RUBELLA VAC ~~LOC~~ INJ
0.5000 mL | INJECTION | Freq: Once | SUBCUTANEOUS | Status: AC
  Administered 2013-06-09: 0.5 mL via SUBCUTANEOUS
  Filled 2013-06-08: qty 0.5

## 2013-06-08 NOTE — Anesthesia Postprocedure Evaluation (Signed)
Anesthesia Post Note  Patient: Susan Key  Procedure(s) Performed: * No procedures listed *  Anesthesia type: Epidural  Patient location: Mother/Baby  Post pain: Pain level controlled  Post assessment: Post-op Vital signs reviewed  Last Vitals:  Filed Vitals:   06/08/13 0530  BP: 94/63  Pulse: 80  Temp: 36.7 C  Resp: 18    Post vital signs: Reviewed  Level of consciousness:alert  Complications: No apparent anesthesia complications

## 2013-06-08 NOTE — Progress Notes (Signed)
PPD 1 SVD  S:  Reports feeling good- up moving around             Tolerating po/ No nausea or vomiting             Bleeding is moderate             Pain controlled withIbuprofen             Up ad lib / ambulatory / voiding QS  Newborn breastfeeding     O:               VS: BP 94/63  Pulse 80  Temp(Src) 98.1 F (36.7 C) (Oral)  Resp 18  Ht 5\' 5"  (1.651 m)  Wt 65.772 kg (145 lb)  BMI 24.13 kg/m2  SpO2 100%   LABS:  Recent Labs  06/07/13 1330 06/08/13 0625  WBC 12.1* 16.3*  HGB 13.3 12.4  PLT 249 228                                         I&O: Intake/Output     07/13 0701 - 07/14 0700 07/14 0701 - 07/15 0700   Blood 200    Total Output 200     Net -200           Rubella indeterminate, received vaccine after first pregnancy              Physical Exam:             Alert and oriented X3  Lungs: Clear and unlabored  Heart: regular rate and rhythm / no mumurs  Abdomen: soft, non-tender, non-distended              Fundus: firm, non-tender, U/1  Perineum: repair intact   Lochia: moderate  Extremities: absent edema, no calf pain or tenderness    A: PPD # 1   Doing well - stable status  P:  Routine post partum orders  Rhogam as indicated  Rubella vaccine prior to d/c  Donette Larry, MSN, CNM 06/08/2013 1105

## 2013-06-08 NOTE — H&P (Signed)
NAMENOLAN, LASSER NO.:  [id number]  MEDICAL RECORD NO.:  [id number]  LOCATION:  9109                          FACILITY:  [facility]  PHYSICIAN:  Lenoard Aden, M.D.DATE OF BIRTH:  26-Mar-1981  DATE OF ADMISSION:  06/07/2013 DATE OF DISCHARGE:                             HISTORY & PHYSICAL   CHIEF COMPLAINT:  Leakage of amniotic fluid.  She is a 32 year old white female, G4, P2, at 38-3/7th weeks gestation who presents with spontaneous leakage of fluid since this morning.  She is GBS negative.  She denies regular contractions.  She denies any bleeding.  Prenatal course has been uncomplicated to date.  PAST MEDICAL HISTORY:  Remarkable.  No known drug allergies.  MEDICATIONS:  Prenatal vitamins.  She has a personal medical history of kidney stone in the past and history of a miscarriage.  FAMILY HISTORY:  COPD, rheumatoid arthritis, and heart disease.  She has a history of 2 spontaneous vaginal deliveries.  The 2nd 1 occurring precipitously in 2013.  Also surgical history of chest tube placement for unknown etiology in 1997.  On physical exam, GENERAL:  She is a well-developed, well-nourished, comfortable white female, in no acute distress. HEENT normal. NECK:  Supple.  Full range of motion. LUNGS:  Clear. HEART:  Regular rhythm. ABDOMEN:  Soft, gravid, nontender.  Estimated fetal weight 7.5-8 pounds. Cervix is 5-6 cm, 90% vertex, 0 station.  Amniotomy of clear fluid of 4 bag noted after leaking fluid confirmed. EXTREMITIES:  No cords. NEUROLOGIC:  Nonfocal. SKIN:  Intact.  NST is reactive.  IMPRESSION: 1. Spontaneous rupture of membranes at term. 2. Advanced dilatation.  PLAN:  Admit for continuous fetal monitoring.  Anticipate attempts at vaginal delivery.  Epidural offered.     Lenoard Aden, M.D.     RJT/MEDQ  D:  06/07/2013  T:  06/08/2013  Job:  [id number]

## 2013-06-08 NOTE — Progress Notes (Signed)
Pt requested Bath and Hepatitis be given after breakfast.

## 2013-06-09 LAB — RH IG WORKUP (INCLUDES ABO/RH)
ABO/RH(D): A NEG
Fetal Screen: NEGATIVE
Unit division: 0

## 2013-06-09 MED ORDER — IBUPROFEN 600 MG PO TABS: 600.0000 mg | ORAL_TABLET | Freq: Four times a day (QID) | ORAL | Status: DC | PRN

## 2013-06-09 NOTE — Lactation Note (Signed)
This note was copied from the chart of Susan Key. Lactation Consultation Note  Patient Name: Susan Key [id number] Date: 06/09/2013 Reason for consult: Follow-up assessment   Maternal Data    Feeding Feeding Type: Breast Milk Feeding method: Breast Length of feed: 20 min  LATCH Score/Interventions Latch: Grasps breast easily, tongue down, lips flanged, rhythmical sucking.  Audible Swallowing: A few with stimulation Intervention(s): Skin to skin  Type of Nipple: Everted at rest and after stimulation  Comfort (Breast/Nipple): Filling, red/small blisters or bruises, mild/mod discomfort  Problem noted: Mild/Moderate discomfort;Filling  Hold (Positioning): No assistance needed to correctly position infant at breast. Intervention(s): Breastfeeding basics reviewed;Support Pillows;Position options  LATCH Score: 8  Lactation Tools Discussed/Used     Consult Status Consult Status: Complete  Experienced Bf mom had baby latched to breast when I went in. Mom reports some tenderness on nipples. Assisted mom with deeper latch and mom reports that feels better. Reviewed wide open mouth and keeping the baby close to breast through the entire feeding- I think she is letting the baby slide to the tip of the nipple. Positional strip noted on nipple. Suggested changing positions to help with healing and to rub EBM into nipple after nursing. No questions at present. Reviewed BFSG and OP appointments for support after DC> To call prn  Pamelia Hoit 06/09/2013, 8:21 AM

## 2013-06-09 NOTE — Discharge Summary (Signed)
Obstetric Discharge Summary Reason for Admission: [id number] @[redacted]w[redacted]d , SROM, active labor Prenatal Procedures: none Intrapartum Procedures: spontaneous vaginal delivery Postpartum Procedures: Rhogam, MMR Vaccine Complications-Operative and Postpartum: 2nd degree perineal laceration Hemoglobin  Date Value Range Status  06/08/2013 12.4  12.0 - 15.0 g/dL Final  45/40/9811 91.4   Final     HCT  Date Value Range Status  06/08/2013 36.9  36.0 - 46.0 % Final  10/10/2012 39   Final    Physical Exam:  General: alert and cooperative Lochia: appropriate Uterine Fundus: firm, U-1 Incision: healing well, no significant drainage DVT Evaluation: No evidence of DVT seen on physical exam.  Discharge Diagnoses: Term Pregnancy-delivered  Discharge Information: Date: 06/09/2013 Activity: pelvic rest Diet: routine Medications: PNV and Ibuprofen Condition: stable Instructions: refer to practice specific booklet Discharge to: home Follow-up Information   Follow up with Lenoard Aden, MD In 6 weeks.   Contact information:   [address] [place] [address] [phone]       Newborn Data: Live born female on 06/07/2013 Birth Weight: 6 lb 9.4 oz (2988 g) APGAR: 9, 9  Home with mother.  Donette Larry, MSN, CNM  06/09/2013, 9:28 AM

## 2013-06-09 NOTE — Discharge Summary (Signed)
Reviewed and agree with note and plan. V.Kristyl Athens, MD  

## 2013-06-09 NOTE — Progress Notes (Signed)
Patient ID: Susan Key, female   DOB: 01/07/1981, 32 y.o.   MRN: [id number] PPD # 2  Subjective: Pt reports feeling good/ Pain controlled with Ibuprofen Tolerating po/ Voiding without problems/ No n/v Bleeding is light/ Breasts starting to feel full Newborn info:  Information for the patient's newborn:  Susan, Caserta Girl Key [[id number]]  female Feeding: breast, going well    Objective:  VS: Blood pressure 102/67, pulse 71, temperature 98.5 F (36.9 C), temperature source Oral, resp. rate 18, height 5\' 5"  (1.651 m), weight 65.772 kg (145 lb), SpO2 100.00%, currently breastfeeding.    Recent Labs  06/07/13 1330 06/08/13 0625  WBC 12.1* 16.3*  HGB 13.3 12.4  HCT 39.2 36.9  PLT 249 228    Blood type: --/--/A NEG (07/14 0625) Rubella:  Non-Immune   Physical Exam:  General: A & O x 3  alert and cooperative Uterine Fundus: firm, below umbilicus, nontender Perineum: is normal, healing with good reapproximation and no evidence of infection or separation Lochia: minimal Ext: no edema, redness or tenderness in the calves or thighs    A/P: PPD # 2/ [id number]/ S/P:spontaneous vaginal Doing well and stable for discharge home RX: Ibuprofen 600mg  po Q 6 hrs prn pain #30 Refill x 1 MMR prior to discharge WOB/GYN booklet given Routine pp visit in 6wks   Lindaann Gradilla, MSN, CNM 06/09/2013, 8:49 AM

## 2013-06-11 ENCOUNTER — Inpatient Hospital Stay (HOSPITAL_COMMUNITY): Admission: RE | Admit: 2013-06-11 | Payer: BC Managed Care – PPO | Source: Ambulatory Visit

## 2014-09-27 ENCOUNTER — Encounter (HOSPITAL_COMMUNITY): Payer: Self-pay | Admitting: *Deleted

## 2017-01-25 ENCOUNTER — Encounter: Payer: Self-pay | Admitting: Gastroenterology

## 2017-03-12 ENCOUNTER — Encounter: Payer: Self-pay | Admitting: Gastroenterology

## 2017-03-12 ENCOUNTER — Encounter (INDEPENDENT_AMBULATORY_CARE_PROVIDER_SITE_OTHER): Payer: Self-pay

## 2017-03-12 ENCOUNTER — Ambulatory Visit (INDEPENDENT_AMBULATORY_CARE_PROVIDER_SITE_OTHER): Payer: BLUE CROSS/BLUE SHIELD | Admitting: Gastroenterology

## 2017-03-12 VITALS — BP 84/58 | HR 88 | Ht 64.37 in | Wt 127.2 lb

## 2017-03-12 DIAGNOSIS — R194 Change in bowel habit: Secondary | ICD-10-CM

## 2017-03-12 NOTE — Patient Instructions (Signed)
Colon cancer screening should start at age 38.

## 2017-03-12 NOTE — Progress Notes (Signed)
Review of pertinent gastrointestinal problems: 1. Rectal bleeding, likely from hemorrhoids were small anal fissure. Colonoscopy Dr. Christella Hartigan May 2013 for the rectal bleeding found internal and external hemorrhoids but was otherwise normal. I recommended she try daily fiber supplements.   HPI: This is a  very pleasant 36 year old woman  who was referred to me by Teena Irani, PA-C  to evaluate  change in bowel habits .    Chief complaint is change in stool character  IN the past few monhts, change in stools.  They started to float more in the toilette but not other issues.    Not every BM her stools will float. She has not changed dietary habits.  Had fissure like symptoms; was mildly sore (not like the extremely painful ones in the past).    She has fissure-like symptoms intermittently for many years. Small amount of rectal bleeding in some pain at her backside. Her most recent event was much less severe than usual. Just a little pink in the water of the toilet. Mild tenderness at the site. She would not have sought medical care if she wasn't RE set up to see me to discuss her floating stools.  GM has had polyps;  no colon cancer in the family  Sometimes smaller stools, sometimes smaller stools.  Overall stable weight.   Review of systems: Pertinent positive and negative review of systems were noted in the above HPI section. Complete review of systems was performed and was otherwise normal.   Past Medical History:  Diagnosis Date  . Allergy   . Anal fissure   . Kidney stones 2004  . PP care - s/p SVD 2/15 01/11/2012    Past Surgical History:  Procedure Laterality Date  . CHEST TUBE INSERTION    . tooth extraction and dental implant    . WISDOM TOOTH EXTRACTION      Current Outpatient Prescriptions  Medication Sig Dispense Refill  . Multiple Vitamin (MULTIVITAMIN) tablet Take 1 tablet by mouth daily.     No current facility-administered medications for this visit.      Allergies as of 03/12/2017  . (No Known Allergies)    Family History  Problem Relation Age of Onset  . Breast cancer Maternal Grandmother   . Colon polyps Maternal Grandmother     Cancerous  . Arthritis Maternal Grandmother   . Hyperlipidemia Maternal Grandmother   . Bradycardia Father     pacemaker  . Kidney disease Brother   . Early death Paternal Uncle   . Bone cancer Paternal Grandmother   . Lung cancer Paternal Grandfather   . Hyperlipidemia Maternal Grandfather   . Esophageal cancer Neg Hx   . Prostate cancer Neg Hx   . Rectal cancer Neg Hx   . Stomach cancer Neg Hx     Social History   Social History  . Marital status: Married    Spouse name: N/A  . Number of children: 3  . Years of education: N/A   Occupational History  . Stay at home Mom    Social History Main Topics  . Smoking status: Never Smoker  . Smokeless tobacco: Never Used  . Alcohol use No  . Drug use: No  . Sexual activity: Yes   Other Topics Concern  . Not on file   Social History Narrative  . No narrative on file     Physical Exam: Ht 5' 4.37" (1.635 m) Comment: height measured without shoes  Wt 127 lb 4 oz (57.7 kg)  BMI 21.59 kg/m  Constitutional: generally well-appearing Psychiatric: alert and oriented x3 Eyes: extraocular movements intact Mouth: oral pharynx moist, no lesions Neck: supple no lymphadenopathy Cardiovascular: heart regular rate and rhythm Lungs: clear to auscultation bilaterally Abdomen: soft, nontender, nondistended, no obvious ascites, no peritoneal signs, normal bowel sounds Extremities: no lower extremity edema bilaterally Skin: no lesions on visible extremities   Assessment and plan: 36 y.o. female with  Floating stools, history of chronic intermittent anal fissures  She has noticed that her stools tend to be floating recently. Not every stool but most throughout the week. Otherwise she has had no change at all in her bowel habits. Her weight is  stable. I reassured her that this is very unlikely to be reflective of anything serious. Much more likely this is just normal physiologic variation in bowel habits that people can encounter throughout their life. She knows to be on the look out for more significant changes such as abrupt change in constipation, diarrhea or significant change in bleeding. No need for any further GI testing or evaluation currently    Please see the "Patient Instructions" section for addition details about the plan.   Rob Bunting, MD [facility] 03/12/2017, 9:26 AM  Cc: Teena Irani, PA-C

## 2019-07-10 ENCOUNTER — Other Ambulatory Visit: Payer: Self-pay | Admitting: Obstetrics and Gynecology

## 2019-07-10 DIAGNOSIS — Z1231 Encounter for screening mammogram for malignant neoplasm of breast: Secondary | ICD-10-CM

## 2019-08-24 ENCOUNTER — Ambulatory Visit
Admission: RE | Admit: 2019-08-24 | Discharge: 2019-08-24 | Disposition: A | Discharge status: Home / Self Care | Payer: BC Managed Care – PPO | Source: Ambulatory Visit | Attending: Obstetrics and Gynecology | Admitting: Obstetrics and Gynecology

## 2019-08-24 ENCOUNTER — Other Ambulatory Visit: Payer: Self-pay

## 2019-08-24 DIAGNOSIS — Z1231 Encounter for screening mammogram for malignant neoplasm of breast: Secondary | ICD-10-CM

## 2019-08-25 ENCOUNTER — Other Ambulatory Visit: Payer: Self-pay | Admitting: Obstetrics and Gynecology

## 2019-08-25 DIAGNOSIS — R928 Other abnormal and inconclusive findings on diagnostic imaging of breast: Secondary | ICD-10-CM

## 2019-08-28 ENCOUNTER — Other Ambulatory Visit: Payer: Self-pay

## 2019-08-28 ENCOUNTER — Ambulatory Visit
Admission: RE | Admit: 2019-08-28 | Discharge: 2019-08-28 | Disposition: A | Discharge status: Home / Self Care | Payer: BC Managed Care – PPO | Source: Ambulatory Visit | Attending: Obstetrics and Gynecology | Admitting: Obstetrics and Gynecology

## 2019-08-28 ENCOUNTER — Ambulatory Visit: Payer: BC Managed Care – PPO

## 2019-08-28 DIAGNOSIS — R928 Other abnormal and inconclusive findings on diagnostic imaging of breast: Secondary | ICD-10-CM

## 2019-09-30 ENCOUNTER — Encounter: Payer: Self-pay | Admitting: Family Medicine

## 2020-06-23 ENCOUNTER — Other Ambulatory Visit: Payer: Self-pay | Admitting: Obstetrics and Gynecology

## 2020-06-23 DIAGNOSIS — Z1231 Encounter for screening mammogram for malignant neoplasm of breast: Secondary | ICD-10-CM

## 2020-08-26 ENCOUNTER — Ambulatory Visit: Payer: BC Managed Care – PPO

## 2020-09-02 ENCOUNTER — Ambulatory Visit
Admission: RE | Admit: 2020-09-02 | Discharge: 2020-09-02 | Disposition: A | Discharge status: Home / Self Care | Payer: BC Managed Care – PPO | Source: Ambulatory Visit | Attending: Obstetrics and Gynecology | Admitting: Obstetrics and Gynecology

## 2020-09-02 ENCOUNTER — Other Ambulatory Visit: Payer: Self-pay

## 2020-09-02 DIAGNOSIS — Z1231 Encounter for screening mammogram for malignant neoplasm of breast: Secondary | ICD-10-CM

## 2020-10-14 IMAGING — MG MM DIGITAL SCREENING BILAT W/ TOMO W/ CAD
8 series · 9 of 24 positions shown · non-contrast
Comparison: None

CLINICAL DATA: Screening.

EXAM:
DIGITAL SCREENING BILATERAL MAMMOGRAM WITH TOMO AND CAD

[R CC synth-2D]
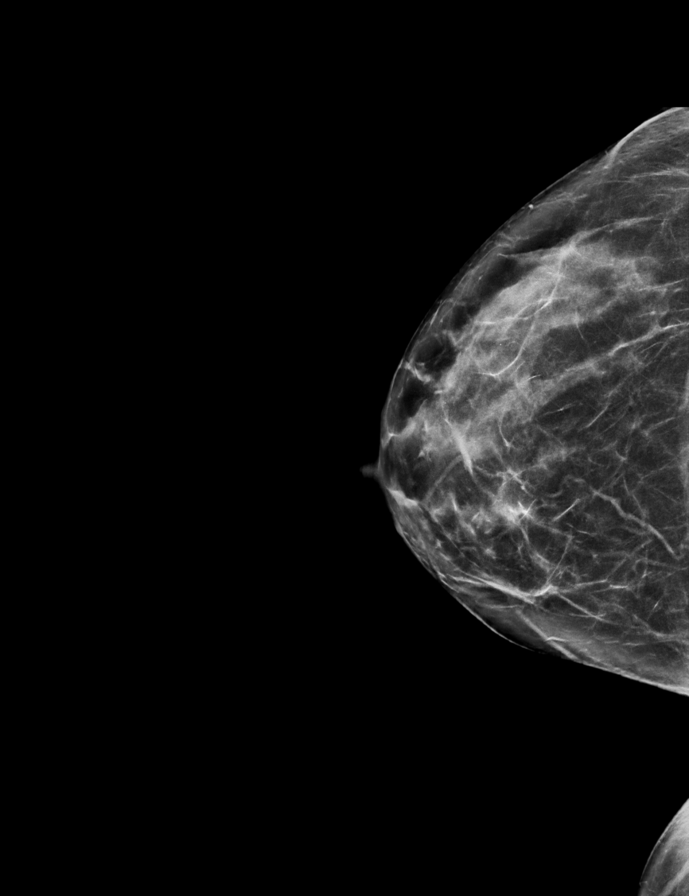

[L MLO synth-2D]
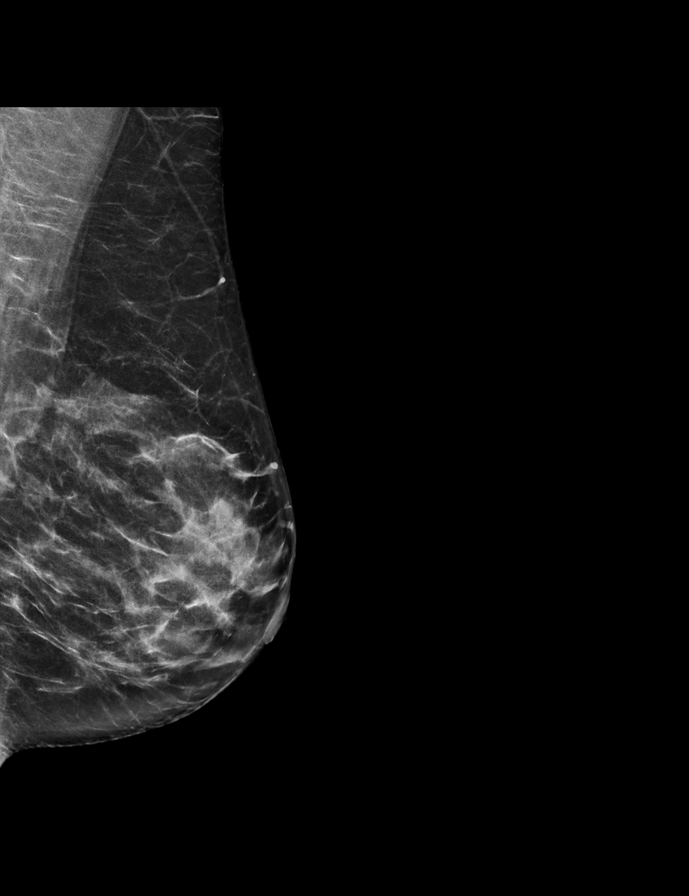

[L CC synth-2D]
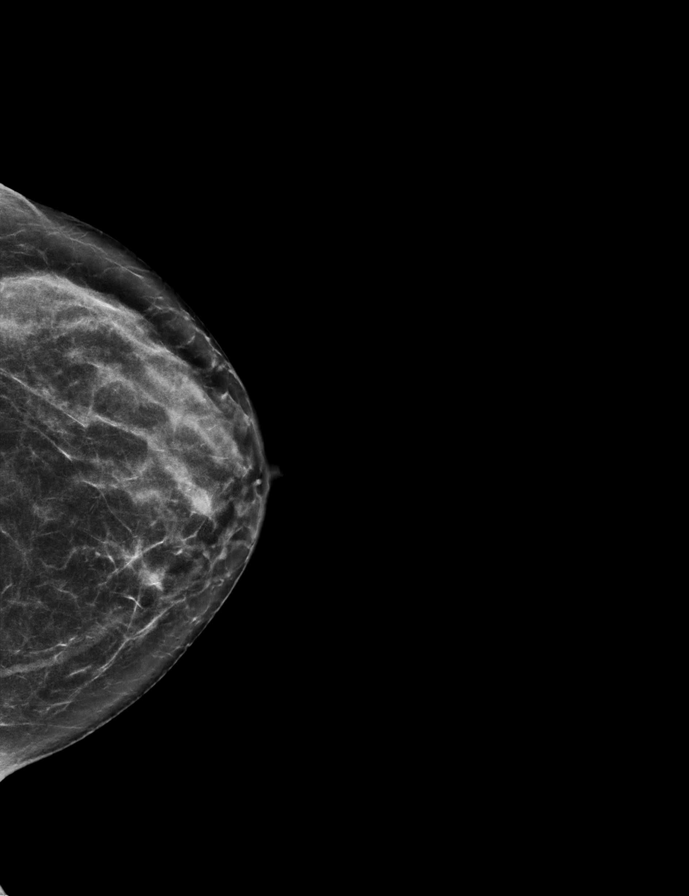

[R MLO synth-2D]
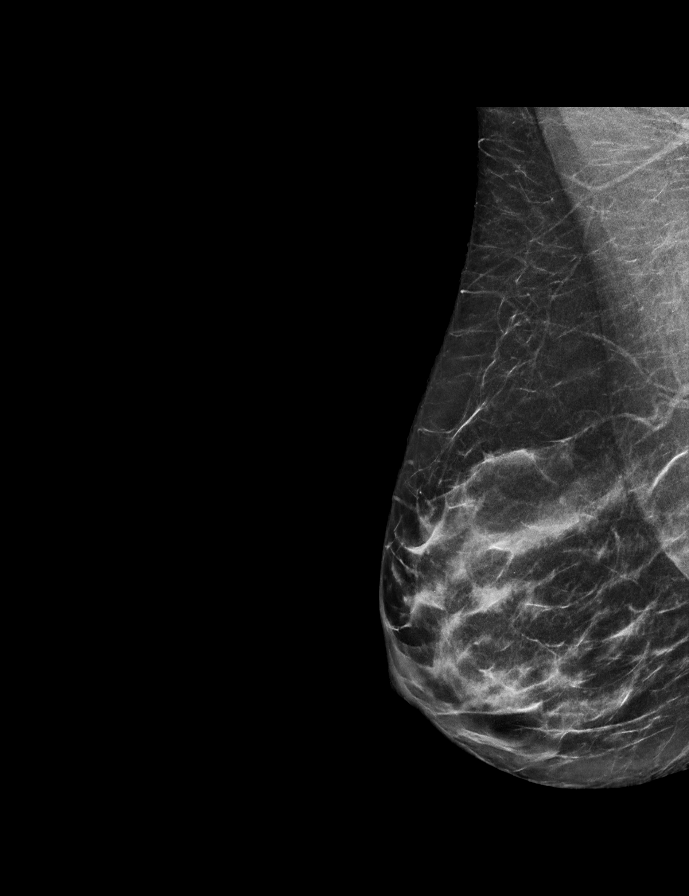

[R MLO tomo · 2 of 58 frames shown]
[frame 19/58]
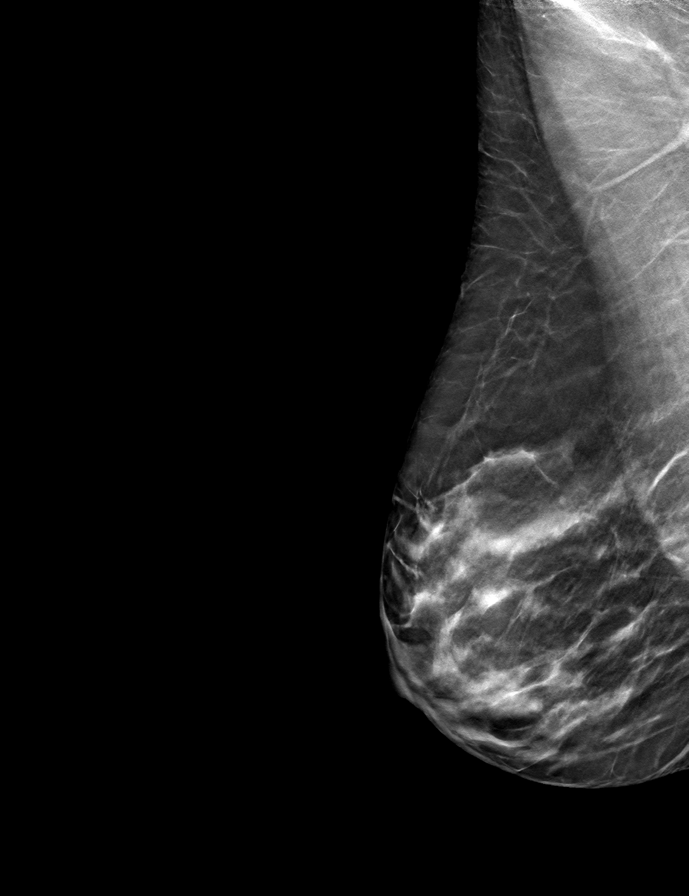
[frame 29/58]
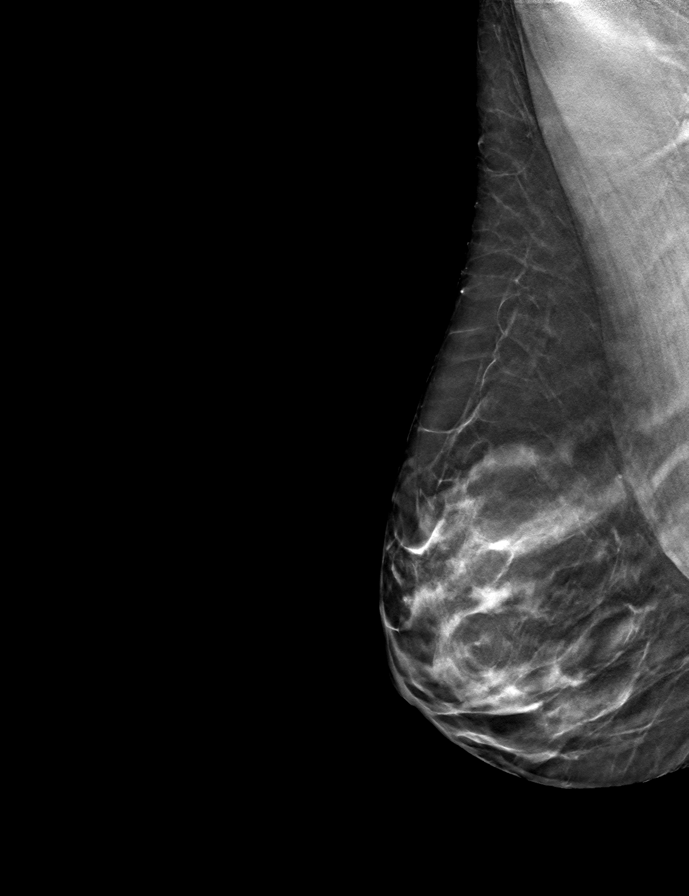

[L MLO tomo · tomo slice 31/60.0]
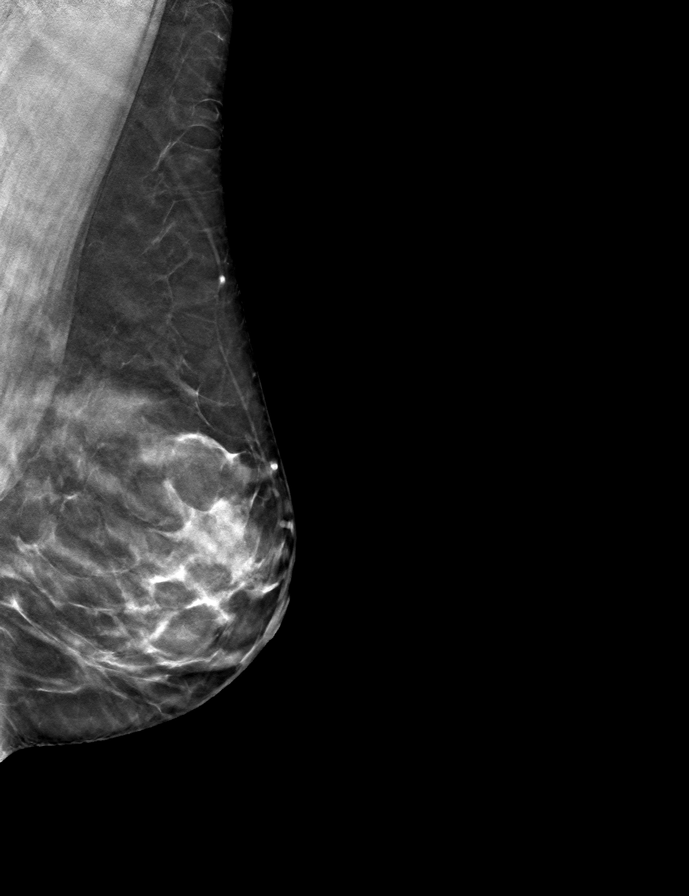

[R CC tomo · tomo slice 32/63.0]
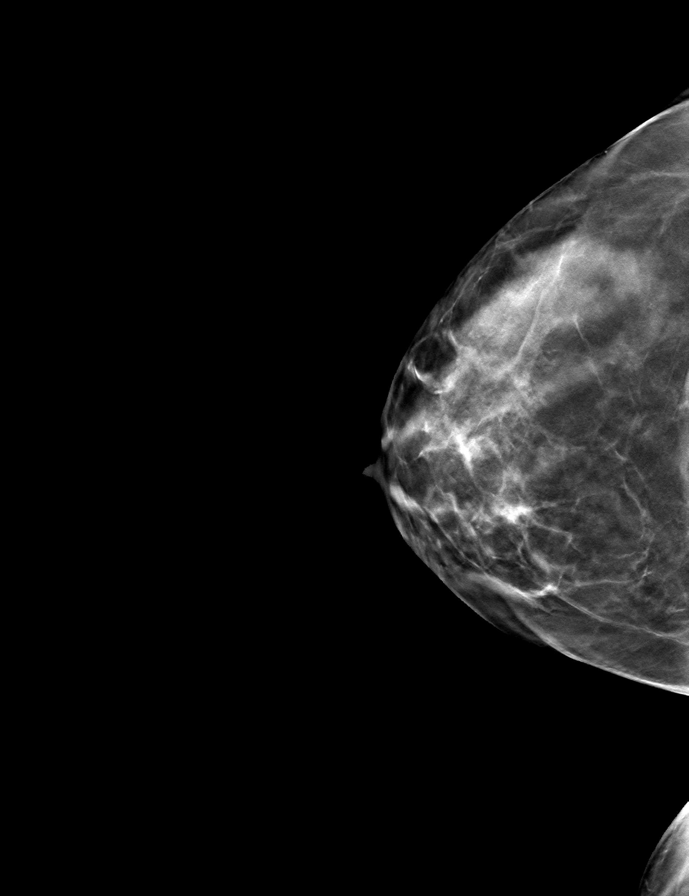

[L CC tomo · tomo slice 29/58.0]
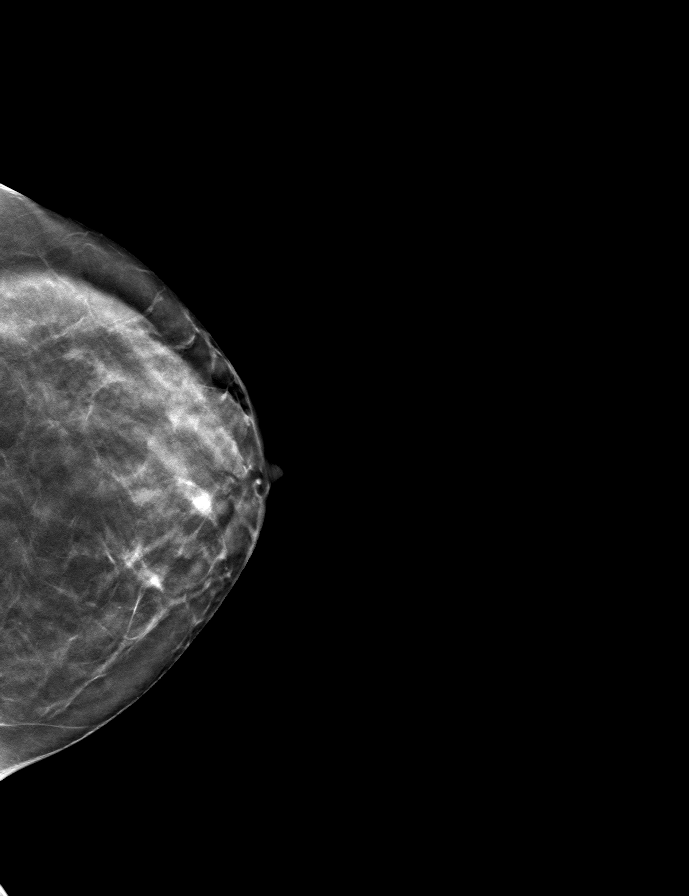

[9 of 24 positions shown; findings below may reference images not displayed]

ACR Breast Density Category c: The breast tissue is heterogeneously
dense, which may obscure small masses.
FINDINGS: In the left breast, a possible mass warrants further evaluation. In
the right breast, no findings suspicious for malignancy.

Images were processed with CAD.
IMPRESSION: Further evaluation is suggested for possible mass in the left
breast.

RECOMMENDATION:
Diagnostic mammogram and possibly ultrasound of the left breast.
(Code:RQ-X-22Y)

The patient will be contacted regarding the findings, and additional
imaging will be scheduled.

BI-RADS CATEGORY  0: Incomplete. Need additional imaging evaluation
and/or prior mammograms for comparison.

## 2020-12-20 ENCOUNTER — Other Ambulatory Visit (INDEPENDENT_AMBULATORY_CARE_PROVIDER_SITE_OTHER): Payer: 59

## 2020-12-20 ENCOUNTER — Encounter: Payer: Self-pay | Admitting: Nurse Practitioner

## 2020-12-20 ENCOUNTER — Ambulatory Visit (INDEPENDENT_AMBULATORY_CARE_PROVIDER_SITE_OTHER): Payer: 59 | Admitting: Nurse Practitioner

## 2020-12-20 VITALS — BP 90/60 | HR 70 | Ht 65.0 in | Wt 131.0 lb

## 2020-12-20 DIAGNOSIS — R58 Hemorrhage, not elsewhere classified: Secondary | ICD-10-CM | POA: Diagnosis not present

## 2020-12-20 DIAGNOSIS — K602 Anal fissure, unspecified: Secondary | ICD-10-CM

## 2020-12-20 DIAGNOSIS — K625 Hemorrhage of anus and rectum: Secondary | ICD-10-CM

## 2020-12-20 LAB — CBC
HCT: 39.4 % (ref 36.0–46.0)
Hemoglobin: 13.2 g/dL (ref 12.0–15.0)
MCHC: 33.4 g/dL (ref 30.0–36.0)
MCV: 85.9 fl (ref 78.0–100.0)
Platelets: 270 10*3/uL (ref 150.0–400.0)
RBC: 4.58 Mil/uL (ref 3.87–5.11)
RDW: 13.3 % (ref 11.5–15.5)
WBC: 7.8 10*3/uL (ref 4.0–10.5)

## 2020-12-20 MED ORDER — AMBULATORY NON FORMULARY MEDICATION: 1 refills | Status: DC
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
## 2020-12-20 NOTE — Patient Instructions (Addendum)
If you are age 42 or older, your body mass index should be between 23-30. Your Body mass index is 21.8 kg/m. If this is out of the aforementioned range listed, please consider follow up with your Primary Care Provider.  If you are age 70 or younger, your body mass index should be between 19-25. Your Body mass index is 21.8 kg/m. If this is out of the aformentioned range listed, please consider follow up with your Primary Care Provider.   LABS: Your provider has requested that you go to the basement level for lab work before leaving today. Press "B" on the elevator. The lab is located at the first door on the left as you exit the elevator.  HEALTHCARE LAWS AND MY CHART RESULTS: Due to recent changes in healthcare laws, you may see the results of your imaging and laboratory studies on MyChart before your provider has had a chance to review them.  We understand that in some cases there may be results that are confusing or concerning to you. Not all laboratory results come back in the same time frame and the provider may be waiting for multiple results in order to interpret others.  Please give [place] 48 hours in order for your provider to thoroughly review all the results before contacting the office for clarification of your results.   You have been scheduled for a colonoscopy. Please follow written instructions given to you at your visit today.  Please pick up your prep supplies at the pharmacy within the next 1-3 days. If you use inhalers (even only as needed), please bring them with you on the day of your procedure.  Use Vaseline or Desitin, apply small amount around the external anal area and opening as needed and prior to exercise.    We have sent a prescription for Diltiazem 2%/Lidocaine 2% gel to [facility] for you. Using your index finger, you should apply a small amount of medication inside the anal opening and to the external anal area twice daily x 4 weeks.  [facility]'s  information is below: Address: [address] [place], [place] [address]  [phone]  *Please DO NOT go directly from our office to pick up this medication! Give the pharmacy 1 day to process the prescription as this is compounded and takes time to make.  Please call our office if your symptoms worsen.  It was great seeing you today!  Thank you for entrusting me with your care and choosing [facility].  Arnaldo Natal, CRNP

## 2020-12-21 DIAGNOSIS — K625 Hemorrhage of anus and rectum: Secondary | ICD-10-CM | POA: Insufficient documentation

## 2020-12-21 NOTE — Progress Notes (Signed)
I agree with the above note, plan 

## 2020-12-23 NOTE — Telephone Encounter (Signed)
Dr. Christella Hartigan, refer to office visit 1/25. See msg from patient. The plan was for her to complete her Zio patch study (done for palpitations) prior to proceeding with a colonoscopy. See her msg, she will not have the Zio results prior to her colonoscopy date with you 2/7. Her exam was normal and she isn't having any further palpitations. Please verify if you want patient to reschedule her colonoscopy to a later date, after official Zio patch results received? Thx

## 2021-01-02 ENCOUNTER — Encounter: Payer: 59 | Admitting: Gastroenterology

## 2021-01-03 NOTE — Telephone Encounter (Signed)
Dr. Christella Hartigan, FYI patient will proceed with colonoscopy with your 3/4 as scheduled.   Her 7 day Holter 12/20/2020 results were ok: Indication: Palpitations.     Patient remained in sinus rhythm with minimum HR 57 at 2:29 AM day 3, max HR 114 at 2:48 PM on day 6.  Average HR 72.   Patient remained bradycardic 0.2%  of total time.   Patient remained tachycardic 0.4% of total time.   Few PVCs noted representing 0.4 % of total beats.  6 PVCs noted in bigeminal pattern.   1 patient triggered episode was recorded.  During the episode strip revealed sinus rhythm with heart rate 65 bpm with 2 isolated PVCs noted and no significant arrhythmias noted.   The longest R to R interval 1.07 seconds at 3:29 AM on January 27.   No PACs, no SVT, no A. fib, no VT, no long pauses 3 seconds or more noted.

## 2021-01-23 ENCOUNTER — Encounter: Payer: Self-pay | Admitting: Gastroenterology

## 2021-01-25 ENCOUNTER — Ambulatory Visit (AMBULATORY_SURGERY_CENTER): Payer: 59 | Admitting: Gastroenterology

## 2021-01-25 ENCOUNTER — Other Ambulatory Visit: Payer: Self-pay

## 2021-01-25 ENCOUNTER — Encounter: Payer: Self-pay | Admitting: Gastroenterology

## 2021-01-25 VITALS — BP 115/70 | HR 73 | Temp 97.8°F | Resp 13 | Ht 65.0 in | Wt 131.0 lb

## 2021-01-25 DIAGNOSIS — K625 Hemorrhage of anus and rectum: Secondary | ICD-10-CM

## 2021-01-25 MED ORDER — SODIUM CHLORIDE 0.9 % IV SOLN
500.0000 mL | Freq: Once | INTRAVENOUS | Status: DC
Start: 2021-01-25 — End: 2021-01-25

## 2021-01-25 NOTE — Progress Notes (Signed)
Report given to PACU, vss 

## 2021-01-25 NOTE — Patient Instructions (Signed)
Resume previous diet and medications. ° °Repeat colonoscopy in 10 years. ° ° °YOU HAD AN ENDOSCOPIC PROCEDURE TODAY AT THE [facility]:   Refer to the procedure report that was given to you for any specific questions about what was found during the examination.  If the procedure report does not answer your questions, please call your gastroenterologist to clarify.  If you requested that your care partner not be given the details of your procedure findings, then the procedure report has been included in a sealed envelope for you to review at your convenience later. ° °YOU SHOULD EXPECT: Some feelings of bloating in the abdomen. Passage of more gas than usual.  Walking can help get rid of the air that was put into your GI tract during the procedure and reduce the bloating. If you had a lower endoscopy (such as a colonoscopy or flexible sigmoidoscopy) you may notice spotting of blood in your stool or on the toilet paper. If you underwent a bowel prep for your procedure, you may not have a normal bowel movement for a few days. ° °Please Note:  You might notice some irritation and congestion in your nose or some drainage.  This is from the oxygen used during your procedure.  There is no need for concern and it should clear up in a day or so. ° °SYMPTOMS TO REPORT IMMEDIATELY: ° °Following lower endoscopy (colonoscopy or flexible sigmoidoscopy): ° Excessive amounts of blood in the stool ° Significant tenderness or worsening of abdominal pains ° Swelling of the abdomen that is new, acute ° Fever of 100°F or higher ° ° °For urgent or emergent issues, a gastroenterologist can be reached at any hour by calling [phone]. °Do not use MyChart messaging for urgent concerns.  ° ° °DIET:  We do recommend a small meal at first, but then you may proceed to your regular diet.  Drink plenty of fluids but you should avoid alcoholic beverages for 24 hours. ° °ACTIVITY:  You should plan to take it easy for the rest of  today and you should NOT DRIVE or use heavy machinery until tomorrow (because of the sedation medicines used during the test).   ° °FOLLOW UP: °Our staff will call the number listed on your records 48-72 hours following your procedure to check on you and address any questions or concerns that you may have regarding the information given to you following your procedure. If we do not reach you, we will leave a message.  We will attempt to reach you two times.  During this call, we will ask if you have developed any symptoms of COVID 19. If you develop any symptoms (ie: fever, flu-like symptoms, shortness of breath, cough etc.) before then, please call [phone].  If you test positive for Covid 19 in the 2 weeks post procedure, please call and report this information to us.   ° °If any biopsies were taken you will be contacted by phone or by letter within the next 1-3 weeks.  Please call us at [phone] if you have not heard about the biopsies in 3 weeks.  ° ° °SIGNATURES/CONFIDENTIALITY: °You and/or your care partner have signed paperwork which will be entered into your electronic medical record.  These signatures attest to the fact that that the information above on your After Visit Summary has been reviewed and is understood.  Full responsibility of the confidentiality of this discharge information lies with you and/or your care-partner.  °

## 2021-01-25 NOTE — Op Note (Signed)
[facility] Patient Name: Susan Key Procedure Date: 01/25/2021 10:54 AM MRN: [id number] Endoscopist: Rachael Fee , MD Age: 40 Referring MD:  Date of Birth: 05-19-1981 Gender: Female Account #: [id number] Procedure:                Colonoscopy Indications:              Hematochezia Medicines:                Monitored Anesthesia Care Procedure:                Pre-Anesthesia Assessment:                           - Prior to the procedure, a History and Physical                            was performed, and patient medications and                            allergies were reviewed. The patient's tolerance of                            previous anesthesia was also reviewed. The risks                            and benefits of the procedure and the sedation                            options and risks were discussed with the patient.                            All questions were answered, and informed consent                            was obtained. Prior Anticoagulants: The patient has                            taken no previous anticoagulant or antiplatelet                            agents. ASA Grade Assessment: II - A patient with                            mild systemic disease. After reviewing the risks                            and benefits, the patient was deemed in                            satisfactory condition to undergo the procedure.                           After obtaining informed consent, the colonoscope  was passed under direct vision. Throughout the                            procedure, the patient's blood pressure, pulse, and                            oxygen saturations were monitored continuously. The                            Olympus CF-HQ190 [id number]) Colonoscope was                            introduced through the anus and advanced to the the                            cecum, identified by appendiceal orifice and                             ileocecal valve. The colonoscopy was performed                            without difficulty. The patient tolerated the                            procedure fairly well. Mild hypotension was treated                            succesfully with 10mg  IV ephedrine. The quality of                            the bowel preparation was good. The ileocecal                            valve, appendiceal orifice, and rectum were                            photographed. Scope In: 11:05:54 AM Scope Out: 11:17:58 AM Scope Withdrawal Time: 0 hours 6 minutes 29 seconds  Total Procedure Duration: 0 hours 12 minutes 4 seconds  Findings:                 The entire examined colon appeared normal on direct                            and retroflexion views.                           Site of healed posterior mid line anal fissure was                            evident. Complications:            No immediate complications. Estimated blood loss:                            None. Estimated Blood  Loss:     Estimated blood loss: none. Impression:               - The entire examined colon is normal on direct and                            retroflexion views.                           - Healed posterior mid line anal fissure. Recommendation:           - Patient has a contact number available for                            emergencies. The signs and symptoms of potential                            delayed complications were discussed with the                            patient. Return to normal activities tomorrow.                            Written discharge instructions were provided to the                            patient.                           - Resume previous diet.                           - Continue present medications.                           - Repeat colonoscopy in 10 years for screening. Rachael Fee, MD 01/25/2021 11:20:35 AM This report has been signed electronically.

## 2021-01-25 NOTE — Progress Notes (Signed)
VS by CW  I have reviewed the patient's medical history in detail and updated the computerized patient record.  

## 2021-01-25 NOTE — Progress Notes (Signed)
1113 Ephedrine 10 mg given IV due to low BP, MD updated.  

## 2021-01-27 ENCOUNTER — Telehealth: Payer: Self-pay | Admitting: *Deleted

## 2021-01-27 ENCOUNTER — Encounter: Payer: 59 | Admitting: Gastroenterology

## 2021-01-27 NOTE — Telephone Encounter (Signed)
  Follow up Call-  Call back number 01/25/2021  Post procedure Call Back phone  # [phone]  Permission to leave phone message Yes  Some recent data might be hidden     Patient questions:  Do you have a fever, pain , or abdominal swelling? No. Pain Score  0 *  Have you tolerated food without any problems? Yes.    Have you been able to return to your normal activities? Yes.    Do you have any questions about your discharge instructions: Diet   No. Medications  No. Follow up visit  No.  Do you have questions or concerns about your Care? No.  Actions: * If pain score is 4 or above: No action needed, pain <4.

## 2021-01-27 NOTE — Telephone Encounter (Signed)
Follow up call made. 

## 2021-07-20 ENCOUNTER — Other Ambulatory Visit: Payer: Self-pay | Admitting: Obstetrics and Gynecology

## 2021-07-20 DIAGNOSIS — Z1231 Encounter for screening mammogram for malignant neoplasm of breast: Secondary | ICD-10-CM

## 2021-09-04 ENCOUNTER — Other Ambulatory Visit: Payer: Self-pay

## 2021-09-04 ENCOUNTER — Ambulatory Visit
Admission: RE | Admit: 2021-09-04 | Discharge: 2021-09-04 | Disposition: A | Discharge status: Home / Self Care | Payer: 59 | Source: Ambulatory Visit | Attending: Obstetrics and Gynecology | Admitting: Obstetrics and Gynecology

## 2021-09-04 DIAGNOSIS — Z1231 Encounter for screening mammogram for malignant neoplasm of breast: Secondary | ICD-10-CM

## 2022-01-19 ENCOUNTER — Ambulatory Visit: Payer: 59 | Admitting: Nurse Practitioner

## 2022-07-23 ENCOUNTER — Other Ambulatory Visit: Payer: Self-pay | Admitting: Obstetrics and Gynecology

## 2022-07-23 DIAGNOSIS — Z1231 Encounter for screening mammogram for malignant neoplasm of breast: Secondary | ICD-10-CM

## 2022-09-05 ENCOUNTER — Ambulatory Visit
Admission: RE | Admit: 2022-09-05 | Discharge: 2022-09-05 | Disposition: A | Discharge status: Home / Self Care | Payer: 59 | Source: Ambulatory Visit | Attending: Obstetrics and Gynecology | Admitting: Obstetrics and Gynecology

## 2022-09-05 DIAGNOSIS — Z1231 Encounter for screening mammogram for malignant neoplasm of breast: Secondary | ICD-10-CM

## 2023-08-06 ENCOUNTER — Ambulatory Visit: Payer: 59 | Admitting: Gastroenterology

## 2023-08-06 ENCOUNTER — Encounter: Payer: Self-pay | Admitting: Gastroenterology

## 2023-08-06 VITALS — BP 92/60 | HR 90 | Ht 65.0 in | Wt 132.8 lb

## 2023-08-06 DIAGNOSIS — K6289 Other specified diseases of anus and rectum: Secondary | ICD-10-CM | POA: Insufficient documentation

## 2023-08-06 MED ORDER — HYOSCYAMINE SULFATE 0.125 MG SL SUBL: 0.1250 mg | SUBLINGUAL_TABLET | SUBLINGUAL | 0 refills | Status: AC | PRN

## 2023-08-06 NOTE — Patient Instructions (Addendum)
We have sent the following medications to your pharmacy for you to pick up at your convenience: Levsin 0.125 SL tablet as needed for pain.  May use heating pad to perianal area as needed.  May use Tylenol as needed.  May try [place] as needed.  _______________________________________________________  If your blood pressure at your visit was 140/90 or greater, please contact your primary care physician to follow up on this.  _______________________________________________________  If you are age 44 or older, your body mass index should be between 23-30. Your Body mass index is 22.1 kg/m. If this is out of the aforementioned range listed, please consider follow up with your Primary Care Provider.  If you are age 71 or younger, your body mass index should be between 19-25. Your Body mass index is 22.1 kg/m. If this is out of the aformentioned range listed, please consider follow up with your Primary Care Provider.   ________________________________________________________  The [facility] providers would like to encourage you to use [facility] to communicate with providers for non-urgent requests or questions.  Due to long hold times on the telephone, sending your provider a message by [facility] may be a faster and more efficient way to get a response.  Please allow 48 business hours for a response.  Please remember that this is for non-urgent requests.  _______________________________________________________

## 2023-08-06 NOTE — Progress Notes (Signed)
08/06/2023 Susan Key [id number] 04-Feb-1981   HISTORY OF PRESENT ILLNESS:  This is a 42 year old female who was previously a patient of Dr. Christella Hartigan.  She is here today with complaints of rectal pain.  She says that it wakes her up at night about once a month and last 30 minutes to an hour.  Describes it as an intense cramping type pain.  Has a history of fissure and hemorrhoids, but says that it does note feel the same as when she had these issues.  No rectal bleeding.  Says that she will go to the bathroom to see if she needs to have a bowel movement, but then nothing happens.  Feels like she moves her bowels regularly.  Colonoscopy March 2022 was normal, showed only healed posterior midline fissure.  Past Medical History:  Diagnosis Date   Allergy    Anal fissure    Kidney stones 2004   PP care - s/p SVD 2/15 01/11/2012   Past Surgical History:  Procedure Laterality Date   CHEST TUBE INSERTION     tooth extraction and dental implant     WISDOM TOOTH EXTRACTION      reports that she has never smoked. She has never used smokeless tobacco. She reports that she does not drink alcohol and does not use drugs. family history includes Arthritis in her maternal grandmother; Bone cancer in her paternal grandmother; Bradycardia in her father; Breast cancer in her maternal grandmother; Colon polyps in her maternal grandmother; Early death in her paternal uncle; Hyperlipidemia in her maternal grandfather and maternal grandmother; Kidney disease in her brother; Lung cancer in her paternal grandfather. No Known Allergies    Outpatient Encounter Medications as of 08/06/2023  Medication Sig   Multiple Vitamin (MULTIVITAMIN) tablet Take 1 tablet by mouth daily.   [DISCONTINUED] AMBULATORY NON FORMULARY MEDICATION Medication Name: Diltiazem 2%/Lidocaine 2%   Using your index finger apply a small amount of medication inside the anal opening and to the external anal area twice daily x 4 weeks.    No facility-administered encounter medications on file as of 08/06/2023.    REVIEW OF SYSTEMS  : All other systems reviewed and negative except where noted in the History of Present Illness.   PHYSICAL EXAM: BP 92/60   Pulse 90   Ht 5\' 5"  (1.651 m)   Wt 132 lb 12.8 oz (60.2 kg)   LMP 07/14/2023 (Approximate)   BMI 22.10 kg/m  General: Well developed white female in no acute distress Head: Normocephalic and atraumatic Eyes:  Sclerae anicteric, conjunctiva pink. Ears: Normal auditory acuity  Rectal:  No external abnormalities noted.  DRE did not elicit any pain and no masses or abnormalities felt.  Anoscopy performed and showed only very tiny noninflamed internal hemorrhoids. Musculoskeletal: Symmetrical with no gross deformities  Skin: No lesions on visible extremities Extremities: No edema  Neurological: Alert oriented x 4, grossly non-focal Psychological:  Alert and cooperative. Normal mood and affect  ASSESSMENT AND PLAN: *Rectal pain:  ? Proctalgia fugax.  Wakes her up at night about once a month and last 30 minutes to an hour.  Describes it as an intense cramping type pain.  Has a history of fissure and hemorrhoids, but no fissure or large internal hemorrhoids seen today.  We discussed trying sitz baths but  that could be inconvenient in the middle of the night, can take Tylenol, use a heating pad or ice packs, I will prescribe Levsin sublingual for her to  try.  Could consider trial of suppositories at bedtime or even try diltiazem or nitro gel to relax the area.  Reassurance given.  Prescription for Levsin sent to her pharmacy.   CC:  Teena Irani, PA-C

## 2023-08-07 NOTE — Progress Notes (Signed)
Agree with the assessment and plan as outlined by Jessica Zehr, PA-C. ? ?Vito Cirigliano, DO, FACG ? ?

## 2024-03-25 NOTE — Therapy (Signed)
 OUTPATIENT PHYSICAL THERAPY FEMALE PELVIC EVALUATION   Patient Name: Susan Key MRN: [id number] DOB:30-Jul-1981, 43 y.o., female Today's Date: 03/26/2024  END OF SESSION:  PT End of Session - 03/26/24 1503     Visit Number 1    Date for PT Re-Evaluation 09/26/24    Authorization Type BCBS [id number]   NO AUTH REQUIRED    PT Start Time 1415    PT Stop Time 1455    PT Time Calculation (min) 40 min    Activity Tolerance Patient tolerated treatment well    Behavior During Therapy [facility] for tasks assessed/performed             Past Medical History:  Diagnosis Date   Allergy    Anal fissure    Kidney stones 2004   PP care - s/p SVD 2/15 01/11/2012   Past Surgical History:  Procedure Laterality Date   CHEST TUBE INSERTION     tooth extraction and dental implant     WISDOM TOOTH EXTRACTION     Patient Active Problem List   Diagnosis Date Noted   Rectal pain 08/06/2023   Rectal bleeding 12/21/2020   ANAL FISSURE 04/06/2008   NEPHROLITHIASIS, HX OF 04/03/2008    PCP: Jearlean Mince, PA-C  REFERRING PROVIDER: Adan Holms, MD  REFERRING DIAG: R10.2 (ICD-10-CM) - Pelvic pain  THERAPY DIAG:  Cramp and spasm  Rationale for Evaluation and Treatment: Rehabilitation  ONSET DATE: since novemeber 2024, got worse this spring  SUBJECTIVE:                                                                                                                                                                                           SUBJECTIVE STATEMENT: Pt reports that she has had some pelvic pain RLQ and into right hip She was treated for Interstitial cystitis before in pelvic PT, Dr Dante Dyer thought it was muscular Pt diagnosed with ovarian cysts recently.  Exercises every day, sits in the car a lot, takes 3 kids everywhere   PAIN:  Are you having pain? Yes NPRS scale: 0-7/10 Pain location:  right hip, right groin, belly, right back  Pain type: aching Pain description:  intermittent   Aggravating factors: standing, full bladder, urination Relieving factors: just goes away, sometimes stretch  PRECAUTIONS: None  RED FLAGS: None   WEIGHT BEARING RESTRICTIONS: Yes    FALLS:  Has patient fallen in last 6 months? 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  DIAGNOSTIC FINDINGS: no recent   PATIENT SURVEYS:    PFIQ-7: 14  COGNITION: Overall cognitive status: Within functional limits for tasks assessed     SENSATION: Light touch: Appears intact  LUMBAR SPECIAL TESTS:  Straight leg raise test: Positive for compensation- rotates trunk    GAIT: Assistive device utilized: None Comments: WFL  POSTURE: No Significant postural limitations   LUMBARAROM/PROM: WFL   LOWER EXTREMITY WUJ:WJXBJY functional limitations   LOWER EXTREMITY MMT: at least 4/5 overall   PALPATION:   General: upper chest breathing strategies  Pelvic Alignment: seems even  Abdominal: gripping and symptomatic abdominal trigger points in lateral rt rectus abdominis and lt lateral rectus abdominis                External Perineal Exam: deferred to a future  appt                             Internal Pelvic Floor: deferred to a future appt  Patient confirms identification and approves PT to assess internal pelvic floor and treatment Yes  PELVIC MMT:   MMT eval  Vaginal   Internal Anal Sphincter   External Anal  Sphincter   Puborectalis   Diastasis Recti   (Blank rows = not tested)        TONE: deferred  PROLAPSE: deferred  TODAY'S TREATMENT:                                                                                                                              DATE: 03/26/2024  EVAL see below  Trigger Point Dry Needling  Initial Treatment: Pt instructed on Dry Needling rational, procedures, and possible side effects. Pt instructed to expect mild to moderate muscle soreness later in the day and/or into the next day.  Pt instructed in methods to reduce muscle soreness. Patient was educated on signs and symptoms of infection and other risk factors and advised to seek medical attention should they occur.  Patient verbalized understanding of these instructions and education.   Patient Verbal Consent Given: Yes Education Handout Provided: No   Muscles Treated: bilateral rectus abdominis Electrical Stimulation Performed: No Treatment Response/Outcome: sensation of warmth and objective muscle softening, less spasm, pain relief    PATIENT EDUCATION:  Education details: abdominal anatomy, exam findings,  Person educated: Patient Education method: Explanation, Demonstration, Tactile cues, and Verbal cues Education comprehension: verbalized understanding, returned demonstration, verbal cues required, and tactile cues required  HOME EXERCISE PROGRAM: [id number]  ASSESSMENT:  CLINICAL IMPRESSION: Patient is a 43 y.o. F who was seen today  for physical therapy evaluation and treatment for pelvic pain, abdominal pain radiating into right groin and into her back. She had significant trigger points in bilateral rectus abdominis which felt much better after dry needling and manual massage. Pt was educated on diaphragmatic breathing to lengthen the abdomen and will benefit from cont PT.     OBJECTIVE IMPAIRMENTS: decreased knowledge of condition, decreased ROM, increased muscle spasms, and impaired  tone.   ACTIVITY LIMITATIONS: standing and toileting  PARTICIPATION LIMITATIONS: community activity  PERSONAL FACTORS: Time since onset of injury/illness/exacerbation are also affecting patient's functional outcome.   REHAB POTENTIAL: Excellent  CLINICAL DECISION MAKING: Stable/uncomplicated  EVALUATION COMPLEXITY: Low   GOALS: Goals reviewed with patient? Yes  SHORT TERM GOALS: Target date: 04/23/2024    Pt will be independent with HEP.   Baseline: Goal status: INITIAL  2.  Pt will report reduced abdominal and right hip/ pelvic pain to max 3 /10 with standing for 30 mins Baseline:  Goal status: INITIAL  3.  Pt will be I with abdominal massage to help relieve spasm Baseline:  Goal status: INITIAL   LONG TERM GOALS: Target date: 09/26/2024  Pt will be independent with advanced HEP.   Baseline:  Goal status: INITIAL  2.  Pt will be able to stand for 30 mins with 0/10 abdominal pain Baseline:  Goal status: INITIAL  3.  Pt will be able to exercise without increased abdominal pain for at least 1 hr Baseline:  Goal status: INITIAL  4.  Pt will be able to bend, lift and twist without increased abdominal and back pain Baseline:  Goal status: INITIAL  5.  Pt will demonstrate functional activities such as walking, coughing and sneezing with 0 leaking urine Baseline:  Goal status: INITIAL  PLAN:  PT FREQUENCY: 1-2x/week  PT DURATION: 6 months  PLANNED INTERVENTIONS: 97110-Therapeutic exercises, 97530- Therapeutic activity, 97112- Neuromuscular re-education, 97535- Self Care, 40981- Manual therapy, [phone]- Electrical stimulation (manual), Patient/Family education, Taping, Dry Needling, Joint mobilization, Joint manipulation, Spinal manipulation, Spinal mobilization, Scar mobilization, Cryotherapy, Moist heat, and Biofeedback  PLAN FOR NEXT SESSION: internal, cont dry needling abdomen, cont diaphragmatic breathing and exercises   Mika Griffitts, PT 03/26/2024, 3:06 PM

## 2024-03-26 ENCOUNTER — Ambulatory Visit: Attending: Obstetrics and Gynecology | Admitting: Physical Therapy

## 2024-03-26 ENCOUNTER — Other Ambulatory Visit: Payer: Self-pay

## 2024-03-26 ENCOUNTER — Encounter: Payer: Self-pay | Admitting: Physical Therapy

## 2024-03-26 DIAGNOSIS — R252 Cramp and spasm: Secondary | ICD-10-CM | POA: Diagnosis present

## 2024-03-26 DIAGNOSIS — R102 Pelvic and perineal pain: Secondary | ICD-10-CM | POA: Diagnosis present

## 2024-03-26 DIAGNOSIS — M6281 Muscle weakness (generalized): Secondary | ICD-10-CM | POA: Insufficient documentation

## 2024-03-26 DIAGNOSIS — M62838 Other muscle spasm: Secondary | ICD-10-CM | POA: Diagnosis present

## 2024-03-26 DIAGNOSIS — R279 Unspecified lack of coordination: Secondary | ICD-10-CM | POA: Diagnosis present

## 2024-04-02 ENCOUNTER — Ambulatory Visit: Admitting: Physical Therapy

## 2024-04-02 DIAGNOSIS — R252 Cramp and spasm: Secondary | ICD-10-CM

## 2024-04-02 NOTE — Therapy (Signed)
 OUTPATIENT PHYSICAL THERAPY FEMALE PELVIC TREATMENT   Patient Name: Susan Key MRN: [id number] DOB:[birth year], 43 y.o., female Today's Date: 04/02/2024  END OF SESSION:  PT End of Session - 04/02/24 0809     Visit Number 2    Date for PT Re-Evaluation 09/26/24    Authorization Type BCBS [id number]   NO AUTH REQUIRED    PT Start Time 0800    PT Stop Time 0840    PT Time Calculation (min) 40 min    Activity Tolerance Patient tolerated treatment well    Behavior During Therapy [facility] for tasks assessed/performed              Past Medical History:  Diagnosis Date   Allergy    Anal fissure    Kidney stones 2004   PP care - s/p SVD 2/15 01/11/2012   Past Surgical History:  Procedure Laterality Date   CHEST TUBE INSERTION     tooth extraction and dental implant     WISDOM TOOTH EXTRACTION     Patient Active Problem List   Diagnosis Date Noted   Rectal pain 08/06/2023   Rectal bleeding 12/21/2020   ANAL FISSURE 04/06/2008   NEPHROLITHIASIS, HX OF 04/03/2008    PCP: Jearlean Mince, PA-C  REFERRING PROVIDER: Adan Holms, MD  REFERRING DIAG: R10.2 (ICD-10-CM) - Pelvic pain  THERAPY DIAG:  Cramp and spasm  Rationale for Evaluation and Treatment: Rehabilitation  ONSET DATE: since novemeber 2024, got worse this spring  SUBJECTIVE:                                                                                                                                                                                           SUBJECTIVE STATEMENT: Pt reports that she felt good after last visit, dry needling helped, pain is in right lower quadrant, radiating into right groin, still some low back pain    PAIN:  Are you having pain? Yes NPRS scale: 0-7/10 Pain location: right hip, right groin, belly, right back  Pain type: aching Pain description: intermittent   Aggravating factors: standing, full bladder, urination Relieving factors: just goes away, sometimes  stretch  PRECAUTIONS: None  RED FLAGS: None   WEIGHT BEARING RESTRICTIONS: Yes    FALLS:  Has patient fallen in last 6 months? 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  DIAGNOSTIC FINDINGS: no recent   PATIENT SURVEYS:    PFIQ-7: 14  COGNITION: Overall cognitive status: Within functional limits for tasks assessed     SENSATION: Light touch: Appears intact  LUMBAR SPECIAL TESTS:  Straight leg raise test: Positive for compensation- rotates trunk    GAIT: Assistive device utilized: None Comments: WFL  POSTURE: No Significant postural limitations   LUMBARAROM/PROM: WFL   LOWER EXTREMITY WUJ:WJXBJY functional limitations   LOWER EXTREMITY MMT: at least 4/5 overall   PALPATION:   General: upper chest breathing strategies  Pelvic Alignment: seems even  Abdominal: gripping and symptomatic abdominal trigger points in lateral rt rectus abdominis and lt lateral rectus abdominis                External Perineal Exam: deferred to a future  appt                             Internal Pelvic Floor: deferred to a future appt  Patient confirms identification and approves PT to assess internal pelvic floor and treatment Yes  PELVIC MMT:   MMT eval  Vaginal   Internal Anal Sphincter   External Anal Sphincter   Puborectalis   Diastasis Recti   (Blank rows = not tested)         TONE: deferred  PROLAPSE: deferred  TODAY'S TREATMENT:                                                                                                                              DATE: 04/02/2024  EVAL see below   Neuro reed- supine diaphragmatic breathing                     Quadruped diaphragmatic breathing                     Half lunge stretch                     QL stretch   Trigger Point Dry Needling  Initial Treatment: Pt instructed on Dry Needling rational, procedures, and possible side effects. Pt instructed to expect mild to moderate muscle soreness later in the day and/or into the next day.  Pt instructed in methods to reduce muscle soreness. Patient was educated on signs and symptoms of infection and other risk factors and advised to seek medical attention should they occur.  Patient verbalized understanding of these instructions and education.   Patient Verbal Consent Given: Yes Education Handout Provided: No   Muscles Treated: bilateral rectus abdominis, RQL. Rt adductors Electrical Stimulation Performed: No Treatment Response/Outcome: sensation of warmth and objective muscle softening, less spasm, pain relief  PATIENT EDUCATION:  Education details: abdominal anatomy, exam findings,  Person educated: Patient Education method: Explanation, Demonstration, Tactile cues, and Verbal cues Education comprehension: verbalized understanding, returned demonstration, verbal cues required, and tactile cues required  HOME EXERCISE PROGRAM: [id number]  ASSESSMENT:  CLINICAL IMPRESSION: Patient is a 43 y.o. F who was seen today for physical therapy treatment for pelvic pain, abdominal pain radiating into right groin and into her back. She had significant trigger points in bilateral rectus abdominis , rt adductors, rt quadratus lumborum which felt much better after dry needling and manual massage. Pt was educated on diaphragmatic breathing to lengthen the abdomen and  stretches and will benefit from cont PT.     OBJECTIVE IMPAIRMENTS: decreased knowledge of condition, decreased ROM, increased muscle spasms, and impaired tone.   ACTIVITY LIMITATIONS: standing and toileting  PARTICIPATION LIMITATIONS: community activity  PERSONAL FACTORS: Time since onset of injury/illness/exacerbation are also affecting patient's functional outcome.   REHAB POTENTIAL: Excellent  CLINICAL DECISION MAKING: Stable/uncomplicated  EVALUATION COMPLEXITY: Low   GOALS: Goals reviewed with patient? Yes  SHORT TERM GOALS: Target date: 04/23/2024    Pt will be independent with HEP.   Baseline: Goal status: INITIAL  2.  Pt will report reduced abdominal and right hip/ pelvic pain to max 3 /10 with standing for 30 mins Baseline:  Goal status: INITIAL  3.  Pt will be I with abdominal massage to help relieve spasm Baseline:  Goal status: INITIAL   LONG TERM GOALS: Target date: 09/26/2024  Pt will be independent with advanced HEP.   Baseline:  Goal status: INITIAL  2.  Pt will be able to stand for 30 mins with 0/10 abdominal pain Baseline:  Goal status: INITIAL  3.  Pt will be able to exercise without increased abdominal pain for at least 1 hr Baseline:  Goal status: INITIAL  4.  Pt will be able to bend, lift and twist without increased abdominal and back pain Baseline:  Goal status: INITIAL  5.  Pt will demonstrate functional activities such as walking, coughing and sneezing with 0 leaking urine Baseline:  Goal status: INITIAL  PLAN:  PT FREQUENCY: 1-2x/week  PT DURATION: 6 months  PLANNED INTERVENTIONS: 97110-Therapeutic exercises, 97530- Therapeutic activity, 97112- Neuromuscular re-education, 97535- Self Care, 40102- Manual therapy, [phone]- Electrical stimulation (manual), Patient/Family education, Taping, Dry Needling, Joint mobilization, Joint manipulation, Spinal manipulation, Spinal mobilization, Scar mobilization, Cryotherapy, Moist heat, and  Biofeedback  PLAN FOR NEXT SESSION: internal, cont dry needling abdomen, cont diaphragmatic breathing and exercises   Bently Morath, PT 04/02/2024, 8:11 AM

## 2024-04-06 ENCOUNTER — Ambulatory Visit: Admitting: Physical Therapy

## 2024-04-06 DIAGNOSIS — R252 Cramp and spasm: Secondary | ICD-10-CM | POA: Diagnosis not present

## 2024-04-06 NOTE — Therapy (Signed)
 OUTPATIENT PHYSICAL THERAPY FEMALE PELVIC TREATMENT   Patient Name: Susan Key MRN: [id number] DOB:[birth year], 43 y.o., female Today's Date: 04/06/2024  END OF SESSION:  PT End of Session - 04/06/24 0851     Visit Number 3    Date for PT Re-Evaluation 09/26/24    Authorization Type BCBS 2025   NO AUTH REQUIRED    PT Start Time 0848    PT Stop Time 0931    PT Time Calculation (min) 43 min    Activity Tolerance Patient tolerated treatment well    Behavior During Therapy [facility] for tasks assessed/performed               Past Medical History:  Diagnosis Date   Allergy    Anal fissure    Kidney stones 2004   PP care - s/p SVD 2/15 01/11/2012   Past Surgical History:  Procedure Laterality Date   CHEST TUBE INSERTION     tooth extraction and dental implant     WISDOM TOOTH EXTRACTION     Patient Active Problem List   Diagnosis Date Noted   Rectal pain 08/06/2023   Rectal bleeding 12/21/2020   ANAL FISSURE 04/06/2008   NEPHROLITHIASIS, HX OF 04/03/2008    PCP: Jearlean Mince, PA-C  REFERRING PROVIDER: Adan Holms, MD  REFERRING DIAG: R10.2 (ICD-10-CM) - Pelvic pain  THERAPY DIAG:  Cramp and spasm  Rationale for Evaluation and Treatment: Rehabilitation  ONSET DATE: since novemeber 2024, got worse this spring  SUBJECTIVE:                                                                                                                                                                                           SUBJECTIVE STATEMENT: Back pain has been much better, had very little. Has had continued Rt anterior hip/lower abdominal pain. Does feel tight with urination intermittent.  Has been referred to different gyn for pelvic congestion.     PAIN:  Are you having pain? Yes NPRS scale: 6/10 Pain location: right hip, right groin, belly, right back  Pain type: aching Pain description: intermittent   Aggravating factors: standing, full bladder,  urination Relieving factors: just goes away, sometimes stretch  PRECAUTIONS: None  RED FLAGS: None   WEIGHT BEARING RESTRICTIONS: Yes    FALLS:  Has patient fallen in last 6 months? 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  DIAGNOSTIC FINDINGS: no recent   PATIENT SURVEYS:    PFIQ-7: 14  COGNITION: Overall cognitive status: Within functional limits for tasks assessed     SENSATION: Light touch: Appears intact  LUMBAR SPECIAL TESTS:  Straight leg raise test: Positive for compensation- rotates trunk    GAIT: Assistive device utilized: None Comments: WFL  POSTURE: No Significant postural limitations   LUMBARAROM/PROM: WFL   LOWER EXTREMITY [id number] functional limitations   LOWER EXTREMITY MMT: at least 4/5 overall   PALPATION:   General: upper chest breathing strategies  Pelvic Alignment: seems even  Abdominal: gripping and symptomatic abdominal trigger points in lateral rt rectus abdominis and lt lateral rectus abdominis                External Perineal Exam: deferred to a future  appt                             Internal Pelvic Floor: deferred to a future appt  Patient confirms identification and approves PT to assess internal pelvic floor and treatment Yes  PELVIC MMT:   MMT eval  Vaginal   Internal Anal Sphincter   External Anal Sphincter   Puborectalis   Diastasis Recti   (Blank rows = not  tested)        TONE: deferred  PROLAPSE: deferred  TODAY'S TREATMENT:                                                                                                                              DATE:  EVAL see below   Neuro reed- supine diaphragmatic breathing                     Quadruped diaphragmatic breathing                     Half lunge stretch                     QL stretch   Trigger Point Dry Needling  Initial Treatment: Pt instructed on Dry Needling rational, procedures, and possible side effects. Pt instructed to expect mild to moderate muscle soreness later in the day and/or into the next day.  Pt instructed in methods to reduce muscle soreness. Patient was educated on signs and symptoms of infection and other risk factors and advised to seek medical attention should they occur.  Patient verbalized understanding of these instructions and education.   Patient Verbal Consent Given: Yes Education Handout Provided: No   Muscles Treated: bilateral rectus abdominis, RQL. Rt adductors Electrical Stimulation Performed: No Treatment Response/Outcome: sensation of warmth and objective  muscle softening, less spasm, pain relief   04/06/24: Vibration plate low setting standing 2x1 min to start session and end session Manual at abdomen focus at mid to Rt quadrants with fascial release techniques and cues for relaxation, decreased gripping of abdominals, and diaphragmatic breathing, pelvic drops throughout. Pt tolerated well and did demonstrate release and reports "I feel like there is more room now" 2x30s mini cobra stretch Open books 3x30s each    PATIENT EDUCATION:  Education details: abdominal anatomy, exam findings,  Person educated: Patient Education method: Explanation, Demonstration, Tactile cues, and Verbal cues Education comprehension: verbalized understanding, returned demonstration, verbal cues required, and tactile cues required  HOME EXERCISE  PROGRAM: [id number]  ASSESSMENT:  CLINICAL IMPRESSION: Patient is a 43 y.o. F who was seen today for physical therapy treatment for pelvic pain, abdominal pain radiating into right groin and into her back. Pt continues to have tension throughout Rt abdomen and anterior hip complex but did demonstrate improvement with release work at abdomen and cues for relaxation. Pt tolerated well, reported improved mobility felt and overall pain is improving. Pt would benefit from additional PT to further address deficits.    OBJECTIVE IMPAIRMENTS: decreased knowledge of condition, decreased ROM, increased muscle spasms, and impaired tone.   ACTIVITY LIMITATIONS: standing and toileting  PARTICIPATION LIMITATIONS: community activity  PERSONAL FACTORS: Time since onset of injury/illness/exacerbation are also affecting patient's functional outcome.   REHAB POTENTIAL: Excellent  CLINICAL DECISION MAKING: Stable/uncomplicated  EVALUATION COMPLEXITY: Low   GOALS: Goals reviewed with patient? Yes  SHORT TERM GOALS: Target date: 04/23/2024    Pt will be independent with HEP.   Baseline: Goal status: INITIAL  2.  Pt will report reduced abdominal and right hip/ pelvic pain to max 3 /10 with standing for 30 mins Baseline:  Goal status: INITIAL  3.  Pt will be I with abdominal massage to help relieve spasm Baseline:  Goal status: INITIAL   LONG TERM GOALS: Target date: 09/26/2024  Pt will be independent with advanced HEP.   Baseline:  Goal status: INITIAL  2.  Pt will be able to stand for 30 mins with 0/10 abdominal pain Baseline:  Goal status: INITIAL  3.  Pt will be able to exercise without increased abdominal pain for at least 1 hr Baseline:  Goal status: INITIAL  4.  Pt will be able to bend, lift and twist without increased abdominal and back pain Baseline:  Goal status: INITIAL  5.  Pt will demonstrate functional activities such as walking, coughing and sneezing with 0 leaking  urine Baseline:  Goal status: INITIAL  PLAN:  PT FREQUENCY: 1-2x/week  PT DURATION: 6 months  PLANNED INTERVENTIONS: 97110-Therapeutic exercises, 97530- Therapeutic activity, 97112- Neuromuscular re-education, 97535- Self Care, 29528- Manual therapy, [phone]- Electrical stimulation (manual), Patient/Family education, Taping, Dry Needling, Joint mobilization, Joint manipulation, Spinal manipulation, Spinal mobilization, Scar mobilization, Cryotherapy, Moist heat, and Biofeedback  PLAN FOR NEXT SESSION: internal, cont dry needling abdomen, cont diaphragmatic breathing and exercises  Avie Lemme, PT, DPT 05/12/259:37 AM

## 2024-04-13 ENCOUNTER — Ambulatory Visit

## 2024-04-13 DIAGNOSIS — M62838 Other muscle spasm: Secondary | ICD-10-CM

## 2024-04-13 DIAGNOSIS — M6281 Muscle weakness (generalized): Secondary | ICD-10-CM

## 2024-04-13 DIAGNOSIS — R279 Unspecified lack of coordination: Secondary | ICD-10-CM

## 2024-04-13 DIAGNOSIS — R102 Pelvic and perineal pain unspecified side: Secondary | ICD-10-CM

## 2024-04-13 DIAGNOSIS — R252 Cramp and spasm: Secondary | ICD-10-CM | POA: Diagnosis not present

## 2024-04-13 NOTE — Therapy (Signed)
 OUTPATIENT PHYSICAL THERAPY FEMALE PELVIC TREATMENT   Patient Name: Susan Key MRN: [id number] DOB:[birth year], 43 y.o., female Today's Date: 04/13/2024  END OF SESSION:  PT End of Session - 04/13/24 1707     Visit Number 4    Date for PT Re-Evaluation 09/26/24    Authorization Type BCBS [id number]   NO AUTH REQUIRED    PT Start Time 1615    PT Stop Time 1658    PT Time Calculation (min) 43 min    Activity Tolerance Patient tolerated treatment well    Behavior During Therapy [facility] for tasks assessed/performed                Past Medical History:  Diagnosis Date   Allergy    Anal fissure    Kidney stones 2004   PP care - s/p SVD 2/15 01/11/2012   Past Surgical History:  Procedure Laterality Date   CHEST TUBE INSERTION     tooth extraction and dental implant     WISDOM TOOTH EXTRACTION     Patient Active Problem List   Diagnosis Date Noted   Rectal pain 08/06/2023   Rectal bleeding 12/21/2020   ANAL FISSURE 04/06/2008   NEPHROLITHIASIS, HX OF 04/03/2008    PCP: Jearlean Mince, PA-C  REFERRING PROVIDER: Adan Holms, MD  REFERRING DIAG: R10.2 (ICD-10-CM) - Pelvic pain  THERAPY DIAG:  Unspecified lack of coordination  Other muscle spasm  Muscle weakness (generalized)  Pelvic pain  Rationale for Evaluation and Treatment: Rehabilitation  ONSET DATE: since novemeber 2024, got worse this spring  SUBJECTIVE:                                                                                                                                                                                           SUBJECTIVE STATEMENT: Pt states that her low back on Rt side has been feeling much better. She notices more pain in the morning and prolonged standing    PAIN: 04/13/24 Are you having pain? Yes NPRS scale: 3/10 Pain location: right hip, right groin, belly, right back  Pain type: aching Pain description: intermittent   Aggravating factors: standing, full  bladder, urination Relieving factors: just goes away, sometimes stretch  PRECAUTIONS: None  RED FLAGS: None   WEIGHT BEARING RESTRICTIONS: Yes    FALLS:  Has patient fallen in last 6 months? 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 04/13/24               External Perineal Exam: WNL                             Internal Pelvic Floor: tenderness throughout Rt side  Patient confirms identification and approves PT to assess internal pelvic floor and treatment Yes  PELVIC MMT:   MMT eval  Vaginal 2/5, 2 second endurance, no ability to repeat contractions; often bears down with contraction  (Blank rows = not tested)        TONE: High tone on Rt, decreased muscle bulk, atrophy on Lt  PROLAPSE: Grade 2 anterior vaginal wall laxity  DIAGNOSTIC FINDINGS: no recent   PATIENT SURVEYS:    PFIQ-7: 14  COGNITION: Overall cognitive status: Within functional limits for tasks assessed     SENSATION: Light touch: Appears intact  LUMBAR SPECIAL TESTS:  Straight leg raise test: Positive for compensation- rotates trunk    GAIT: Assistive device utilized: None Comments: WFL  POSTURE: No Significant postural limitations   LUMBARAROM/PROM: WFL   LOWER EXTREMITY [id number] functional limitations   LOWER EXTREMITY MMT: at least 4/5 overall   PALPATION:   General: upper chest breathing strategies  Pelvic Alignment: seems  even  Abdominal: gripping and symptomatic abdominal trigger points in lateral rt rectus abdominis and lt lateral rectus abdominis                External Perineal Exam: deferred to a future  appt                             Internal Pelvic Floor: deferred to a future appt  Patient confirms identification and approves PT to assess internal pelvic floor and treatment Yes  PELVIC MMT:   MMT eval  Vaginal   Internal Anal Sphincter   External Anal Sphincter   Puborectalis   Diastasis Recti   (Blank rows = not tested)        TONE: deferred  PROLAPSE: deferred  TODAY'S TREATMENT:                                                                                                                              DATE:  EVAL see below   Neuro reed- supine diaphragmatic breathing                     Quadruped diaphragmatic breathing                     Half lunge stretch  QL stretch   Trigger Point Dry Needling  Initial Treatment: Pt instructed on Dry Needling rational, procedures, and possible side effects. Pt instructed to expect mild to moderate muscle soreness later in the day and/or into the next day.  Pt instructed in methods to reduce muscle soreness. Patient was educated on signs and symptoms of infection and other risk factors and advised to seek medical attention should they occur.  Patient verbalized understanding of these instructions and education.   Patient Verbal Consent Given: Yes Education Handout Provided: No   Muscles Treated: bilateral rectus abdominis, RQL. Rt adductors Electrical Stimulation Performed: No Treatment Response/Outcome: sensation of warmth and objective muscle softening, less spasm, pain relief   04/06/24: Vibration plate low setting standing 2x1 min to start session and end session Manual at abdomen focus at mid to Rt quadrants with fascial release techniques and cues for relaxation, decreased gripping of abdominals, and  diaphragmatic breathing, pelvic drops throughout. Pt tolerated well and did demonstrate release and reports "I feel like there is more room now" 2x30s mini cobra stretch Open books 3x30s each  04/13/24 Manual: Pt provides verbal consent for internal vaginal/rectal pelvic floor exam. Internal vaginal pelvic floor muscle assessment Internal vaginal Rt sided pelvic floor muscle release to superficial and deep layers  Trigger Point Dry Needling Subsequent Treatment: Instructions provided previously at initial dry needling treatment.  Instructions reviewed, if requested by the patient, prior to subsequent dry needling treatment.  Patient Verbal Consent Given: Yes Education Handout Provided: Previously Provided Muscles Treated: Rt iliacus, pectineus, common iliopsoas insertion, adductor brevis  Electrical Stimulation Performed: No Treatment Response/Outcome: twitch response/release Neuromuscular re-education: Diaphragmatic breathing for improved pelvic floor muscle relaxation Pelvic floor muscle contraction training in order to improve full A/ROM and awareness if relaxation vs contraction Exercises: Butterfly 10 breaths Standing adductor stretch 60 sec bil Cat cow 10x   PATIENT EDUCATION:  Education details: abdominal anatomy, exam findings,  Person educated: Patient Education method: Explanation, Demonstration, Tactile cues, and Verbal cues Education comprehension: verbalized understanding, returned demonstration, verbal cues required, and tactile cues required  HOME EXERCISE PROGRAM: [email or web address]  ASSESSMENT:  CLINICAL IMPRESSION: Pt doing well with improved back pain, but states that she is continuing to have Rt groin and lower abdominal pain. Due to this persistent discomfort, we performed internal pelvic floor muscle exam this session with findings of significant pelvic floor muscle weakness, decreased pelvic floor muscle endurance, anterior vaginal wall laxity, poor coordination of  contraction vs bearing down in pelvic floor muscle, Lt pelvic floor muscle decreased tone, pelvic floor muscle spasm and high tone Rt (decreased muscle bulk overall, and pain throughout Rt pelvic floor. She tolerated manual release to trigger points in Rt pelvic floor well. We continued dry needling to Rt pelvic muscles with good tolerance and significant twitch response. Adductor stretches and cat cow for overall mobility given this session and HEP updated. Pt will continue to benefit from skilled PT intervention in order to improve Rt lower quadrant pain and address deficits.    OBJECTIVE IMPAIRMENTS: decreased knowledge of condition, decreased ROM, increased muscle spasms, and impaired tone.   ACTIVITY LIMITATIONS: standing and toileting  PARTICIPATION LIMITATIONS: community activity  PERSONAL FACTORS: Time since onset of injury/illness/exacerbation are also affecting patient's functional outcome.   REHAB POTENTIAL: Excellent  CLINICAL DECISION MAKING: Stable/uncomplicated  EVALUATION COMPLEXITY: Low   GOALS: Goals reviewed with patient? Yes  SHORT TERM GOALS: Target date: 04/23/2024    Pt will be independent with HEP.   Baseline: Goal status:  INITIAL  2.  Pt will report reduced abdominal and right hip/ pelvic pain to max 3 /10 with standing for 30 mins Baseline:  Goal status: INITIAL  3.  Pt will be I with abdominal massage to help relieve spasm Baseline:  Goal status: INITIAL   LONG TERM GOALS: Target date: 09/26/2024  Pt will be independent with advanced HEP.   Baseline:  Goal status: INITIAL  2.  Pt will be able to stand for 30 mins with 0/10 abdominal pain Baseline:  Goal status: INITIAL  3.  Pt will be able to exercise without increased abdominal pain for at least 1 hr Baseline:  Goal status: INITIAL  4.  Pt will be able to bend, lift and twist without increased abdominal and back pain Baseline:  Goal status: INITIAL  5.  Pt will demonstrate functional  activities such as walking, coughing and sneezing with 0 leaking urine Baseline:  Goal status: INITIAL  PLAN:  PT FREQUENCY: 1-2x/week  PT DURATION: 6 months  PLANNED INTERVENTIONS: 97110-Therapeutic exercises, 97530- Therapeutic activity, 97112- Neuromuscular re-education, 97535- Self Care, [id number]- Manual therapy, [phone]- Electrical stimulation (manual), Patient/Family education, Taping, Dry Needling, Joint mobilization, Joint manipulation, Spinal manipulation, Spinal mobilization, Scar mobilization, Cryotherapy, Moist heat, and Biofeedback  PLAN FOR NEXT SESSION: internal, cont dry needling abdomen, cont diaphragmatic breathing and exercises

## 2024-04-29 ENCOUNTER — Ambulatory Visit: Attending: Obstetrics and Gynecology | Admitting: Physical Therapy

## 2024-04-29 DIAGNOSIS — R279 Unspecified lack of coordination: Secondary | ICD-10-CM | POA: Diagnosis present

## 2024-04-29 NOTE — Therapy (Addendum)
 OUTPATIENT PHYSICAL THERAPY FEMALE PELVIC TREATMENT/ later date discharge   Patient Name: Susan Key MRN: [id number] DOB:Jun 27, 1981, 43 y.o., female Today's Date: 04/29/2024  END OF SESSION:  PT End of Session - 04/29/24 1100     Visit Number 5    Date for PT Re-Evaluation 09/26/24    Authorization Type BCBS [id number]   NO AUTH REQUIRED    PT Start Time 1015    PT Stop Time 1100    PT Time Calculation (min) 45 min    Activity Tolerance Patient tolerated treatment well    Behavior During Therapy [facility] for tasks assessed/performed                 Past Medical History:  Diagnosis Date   Allergy    Anal fissure    Kidney stones 2004   PP care - s/p SVD 2/15 01/11/2012   Past Surgical History:  Procedure Laterality Date   CHEST TUBE INSERTION     tooth extraction and dental implant     WISDOM TOOTH EXTRACTION     Patient Active Problem List   Diagnosis Date Noted   Rectal pain 08/06/2023   Rectal bleeding 12/21/2020   ANAL FISSURE 04/06/2008   NEPHROLITHIASIS, HX OF 04/03/2008    PCP: Susan Camie Pepper, PA-C  REFERRING PROVIDER: Darol Norris, MD  REFERRING DIAG: R10.2 (ICD-10-CM) - Pelvic pain  THERAPY DIAG:  Unspecified lack of coordination  Rationale for Evaluation and Treatment: Rehabilitation  ONSET DATE: since novemeber 2024, got worse this spring  SUBJECTIVE:                                                                                                                                                                                           SUBJECTIVE STATEMENT: Susan Key  Patient reports that she is feeling pretty good. She has had a little bit of hip pain in the right lower quadrant. 0/10 pain today. No major urinary issues to report. No bowel issues to report.   PAIN: 04/13/24 Are you having pain? Yes NPRS scale: 3/10 Pain location: right hip, right groin, belly, right back  Pain type: aching Pain description: intermittent   Aggravating  factors: standing, full bladder, urination Relieving factors: just goes away, sometimes stretch  PRECAUTIONS: None  RED FLAGS: None   WEIGHT BEARING RESTRICTIONS: Yes    FALLS:  Has patient fallen in last 6 months? 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 04/13/24               External Perineal Exam: WNL                             Internal Pelvic Floor: tenderness throughout Rt side  Patient confirms identification and approves PT to assess internal pelvic floor and treatment Yes  PELVIC MMT:   MMT eval  Vaginal 2/5, 2 second endurance, no ability to repeat contractions; often bears down with contraction  (Blank rows = not tested)        TONE: High tone on Rt, decreased muscle bulk, atrophy on Lt  PROLAPSE: Grade 2 anterior vaginal wall laxity  DIAGNOSTIC FINDINGS: no recent   PATIENT SURVEYS:    PFIQ-7: 14  COGNITION: Overall cognitive status: Within functional limits for tasks assessed     SENSATION: Light touch: Appears intact  LUMBAR SPECIAL TESTS:  Straight leg raise test: Positive for compensation- rotates trunk    GAIT: Assistive device utilized: None Comments: WFL  POSTURE: No Significant postural limitations   LUMBARAROM/PROM: WFL   LOWER EXTREMITY MNF:tpuypw functional limitations   LOWER EXTREMITY MMT: at least 4/5 overall   PALPATION:   General: upper chest breathing  strategies  Pelvic Alignment: seems even  Abdominal: gripping and symptomatic abdominal trigger points in lateral rt rectus abdominis and lt lateral rectus abdominis                External Perineal Exam: deferred to a future  appt                             Internal Pelvic Floor: deferred to a future appt  Patient confirms identification and approves PT to assess internal pelvic floor and treatment Yes  PELVIC MMT:   MMT eval  Vaginal   Internal Anal Sphincter   External Anal Sphincter   Puborectalis   Diastasis Recti   (Blank rows = not tested)        TONE: deferred  PROLAPSE: deferred  TODAY'S TREATMENT:                                                                                                                              DATE:  EVAL see below   Neuro reed- supine diaphragmatic breathing                     Quadruped diaphragmatic breathing                     Half lunge  stretch                     QL stretch   Trigger Point Dry Needling  Initial Treatment: Pt instructed on Dry Needling rational, procedures, and possible side effects. Pt instructed to expect mild to moderate muscle soreness later in the day and/or into the next day.  Pt instructed in methods to reduce muscle soreness. Patient was educated on signs and symptoms of infection and other risk factors and advised to seek medical attention should they occur.  Patient verbalized understanding of these instructions and education.   Patient Verbal Consent Given: Yes Education Handout Provided: No   Muscles Treated: bilateral rectus abdominis, RQL. Rt adductors Electrical Stimulation Performed: No Treatment Response/Outcome: sensation of warmth and objective muscle softening, less spasm, pain relief   04/06/24: Vibration plate low setting standing 2x1 min to start session and end session Manual at abdomen focus at mid to Rt quadrants with fascial release techniques and cues for relaxation,  decreased gripping of abdominals, and diaphragmatic breathing, pelvic drops throughout. Pt tolerated well and did demonstrate release and reports I feel like there is more room now 2x30s mini cobra stretch Open books 3x30s each  04/13/24 Manual: Pt provides verbal consent for internal vaginal/rectal pelvic floor exam. Internal vaginal pelvic floor muscle assessment Internal vaginal Rt sided pelvic floor muscle release to superficial and deep layers  Trigger Point Dry Needling Subsequent Treatment: Instructions provided previously at initial dry needling treatment.  Instructions reviewed, if requested by the patient, prior to subsequent dry needling treatment.  Patient Verbal Consent Given: Yes Education Handout Provided: Previously Provided Muscles Treated: Rt iliacus, pectineus, common iliopsoas insertion, adductor brevis  Electrical Stimulation Performed: No Treatment Response/Outcome: twitch response/release Neuromuscular re-education: Diaphragmatic breathing for improved pelvic floor muscle relaxation Pelvic floor muscle contraction training in order to improve full A/ROM and awareness if relaxation vs contraction Exercises: Butterfly 10 breaths Standing adductor stretch 60 sec bil Cat cow 10x  04/29/24: Manual: Lower right abdominal quadrant STM: cupping, sustained pressure, and mobilization techniques to decrease tension in RLQ  Neuromuscular re-education: Diaphragmatic breathing + pelvic floor lengthening with inhale and shortening with exhale for AROM practice 3x10  Cat/cow + diaphragmatic breathing 2x28min  Sidelying clamshell + reverse clamshell + diaphragmatic breathing 2x10 each side  Bridge + adductor ball squeeze + diaphragmatic breathing 2x10  Therapeutic exercise: Butterfly 10 breaths Standing adductor stretch 60 sec bil Cat cow 10x Hip flexor stretch at end of bed + diaphragmatic breathing 2x43min each   PATIENT EDUCATION:  Education details: abdominal anatomy,  exam findings,  Person educated: Patient Education method: Explanation, Demonstration, Tactile cues, and Verbal cues Education comprehension: verbalized understanding, returned demonstration, verbal cues required, and tactile cues required  HOME EXERCISE PROGRAM: Access Code: [email or web address] URL: [email or web address] Date: 04/29/2024 Prepared by: Celena Domino  Exercises - Supine Diaphragmatic Breathing  - 1 x daily - 7 x weekly - 3 sets - 10 reps - Cat Cow  - 1 x daily - 7 x weekly - 1 sets - 10 reps - Clamshell  - 1 x daily - 7 x weekly - 2 sets - 10 reps - Sidelying Reverse Clamshell  - 1 x daily - 7 x weekly - 2 sets - 10 reps - Supine Bridge with Mini Swiss Ball Between Knees  - 1 x daily - 7 x weekly - 2 sets - 10 reps - Hip Flexor Stretch at Edge of Bed  - 1 x daily - 7 x  weekly - 2 sets - hold - World's Greatest Stretch - Lunge with Ipsilateral Thoracic Rotation  - 1 x daily - 7 x weekly - 3 sets - 10 reps - Supine Quadratus Lumborum Stretch  - 1 x daily - 7 x weekly - 3 sets - 10 reps - Supine Butterfly Groin Stretch  - 1 x daily - 7 x weekly - 1 sets - 1 reps - 2 minutes hold - Side Lunge Adductor Stretch  - 1 x daily - 7 x weekly - 1 sets - 2 reps - 30 sec hold  ASSESSMENT:  CLINICAL IMPRESSION: Pt doing well with improved back pain, but states that she is no longer experiencing intense Rt groin and lower abdominal pain. 0/10 pain today, so we introduced gentle hip strengthening and core strengthening today. Max cueing from PT required to perform diaphragmatic breathing cues properly. Manual interventions were not painful for the patient but she did demonstrate increased muscle tension in RLQ compared to LLQ. No pain following todays session.  Pt will continue to benefit from skilled PT intervention in order to improve Rt lower quadrant pain and address deficits.    OBJECTIVE IMPAIRMENTS: decreased knowledge of condition, decreased ROM, increased muscle spasms, and  impaired tone.   ACTIVITY LIMITATIONS: standing and toileting  PARTICIPATION LIMITATIONS: community activity  PERSONAL FACTORS: Time since onset of injury/illness/exacerbation are also affecting patient's functional outcome.   REHAB POTENTIAL: Excellent  CLINICAL DECISION MAKING: Stable/uncomplicated  EVALUATION COMPLEXITY: Low   GOALS: Goals reviewed with patient? Yes  SHORT TERM GOALS: Target date: 04/23/2024    Pt will be independent with HEP.   Baseline: Goal status: INITIAL  2.  Pt will report reduced abdominal and right hip/ pelvic pain to max 3 /10 with standing for 30 mins Baseline:  Goal status: INITIAL  3.  Pt will be I with abdominal massage to help relieve spasm Baseline:  Goal status: INITIAL   LONG TERM GOALS: Target date: 09/26/2024  Pt will be independent with advanced HEP.   Baseline:  Goal status: INITIAL  2.  Pt will be able to stand for 30 mins with 0/10 abdominal pain Baseline:  Goal status: INITIAL  3.  Pt will be able to exercise without increased abdominal pain for at least 1 hr Baseline:  Goal status: INITIAL  4.  Pt will be able to bend, lift and twist without increased abdominal and back pain Baseline:  Goal status: INITIAL  5.  Pt will demonstrate functional activities such as walking, coughing and sneezing with 0 leaking urine Baseline:  Goal status: INITIAL  PLAN:  PT FREQUENCY: 1-2x/week  PT DURATION: 6 months  PLANNED INTERVENTIONS: 97110-Therapeutic exercises, 97530- Therapeutic activity, 97112- Neuromuscular re-education, 97535- Self Care, [id number]- Manual therapy, [phone]- Electrical stimulation (manual), Patient/Family education, Taping, Dry Needling, Joint mobilization, Joint manipulation, Spinal manipulation, Spinal mobilization, Scar mobilization, Cryotherapy, Moist heat, and Biofeedback  PLAN FOR NEXT SESSION: internal, cont dry needling abdomen, cont diaphragmatic breathing and exercises  Celena Domino, PT,  DPT 04/29/24 11:01 AM  PHYSICAL THERAPY DISCHARGE SUMMARY  Patient agrees to discharge. Patient goals were met. Patient is being discharged due to the patient's request.  Jitka Helmus, PT 06/01/24 2:29 PM

## 2024-05-21 ENCOUNTER — Encounter: Admitting: Physical Therapy

## 2024-06-02 ENCOUNTER — Encounter: Admitting: Physical Therapy

## 2024-06-09 ENCOUNTER — Encounter: Admitting: Physical Therapy

## 2024-06-16 ENCOUNTER — Encounter: Admitting: Physical Therapy

## 2024-06-23 ENCOUNTER — Encounter: Admitting: Physical Therapy
